# Patient Record
Sex: Female | Born: 1975 | Marital: Married | Smoking: Never smoker
Health system: Southern US, Community
[De-identification: ages and names within clinical notes are randomized; demographics above are authoritative.]

## PROBLEM LIST (undated history)

## (undated) DIAGNOSIS — G894 Chronic pain syndrome: Secondary | ICD-10-CM

## (undated) DIAGNOSIS — R059 Cough, unspecified: Secondary | ICD-10-CM

## (undated) DIAGNOSIS — R05 Cough: Secondary | ICD-10-CM

## (undated) DIAGNOSIS — F32A Depression, unspecified: Secondary | ICD-10-CM

## (undated) DIAGNOSIS — R509 Fever, unspecified: Secondary | ICD-10-CM

## (undated) DIAGNOSIS — Z6841 Body Mass Index (BMI) 40.0 and over, adult: Secondary | ICD-10-CM

## (undated) DIAGNOSIS — J069 Acute upper respiratory infection, unspecified: Secondary | ICD-10-CM

## (undated) DIAGNOSIS — G500 Trigeminal neuralgia: Secondary | ICD-10-CM

## (undated) DIAGNOSIS — F419 Anxiety disorder, unspecified: Secondary | ICD-10-CM

## (undated) DIAGNOSIS — M26609 Unspecified temporomandibular joint disorder, unspecified side: Secondary | ICD-10-CM

## (undated) DIAGNOSIS — E039 Hypothyroidism, unspecified: Secondary | ICD-10-CM

## (undated) DIAGNOSIS — D518 Other vitamin B12 deficiency anemias: Secondary | ICD-10-CM

## (undated) DIAGNOSIS — K589 Irritable bowel syndrome without diarrhea: Secondary | ICD-10-CM

## (undated) DIAGNOSIS — K219 Gastro-esophageal reflux disease without esophagitis: Secondary | ICD-10-CM

## (undated) DIAGNOSIS — Z5189 Encounter for other specified aftercare: Secondary | ICD-10-CM

## (undated) DIAGNOSIS — M170 Bilateral primary osteoarthritis of knee: Secondary | ICD-10-CM

## (undated) DIAGNOSIS — F410 Panic disorder [episodic paroxysmal anxiety] without agoraphobia: Secondary | ICD-10-CM

## (undated) DIAGNOSIS — M1990 Unspecified osteoarthritis, unspecified site: Secondary | ICD-10-CM

## (undated) DIAGNOSIS — N3010 Interstitial cystitis (chronic) without hematuria: Secondary | ICD-10-CM

## (undated) DIAGNOSIS — Q600 Renal agenesis, unilateral: Secondary | ICD-10-CM

## (undated) DIAGNOSIS — IMO0002 Reserved for concepts with insufficient information to code with codable children: Secondary | ICD-10-CM

## (undated) DIAGNOSIS — E559 Vitamin D deficiency, unspecified: Secondary | ICD-10-CM

## (undated) DIAGNOSIS — F329 Major depressive disorder, single episode, unspecified: Secondary | ICD-10-CM

## (undated) DIAGNOSIS — H6690 Otitis media, unspecified, unspecified ear: Secondary | ICD-10-CM

## (undated) DIAGNOSIS — E782 Mixed hyperlipidemia: Secondary | ICD-10-CM

## (undated) DIAGNOSIS — K582 Mixed irritable bowel syndrome: Secondary | ICD-10-CM

## (undated) DIAGNOSIS — M797 Fibromyalgia: Secondary | ICD-10-CM

## (undated) DIAGNOSIS — N809 Endometriosis, unspecified: Secondary | ICD-10-CM

## (undated) HISTORY — DX: Trigeminal neuralgia: G50.0

## (undated) HISTORY — DX: Anxiety disorder, unspecified: F41.9

## (undated) HISTORY — DX: Vitamin D deficiency, unspecified: E55.9

## (undated) HISTORY — DX: Hypothyroidism, unspecified: E03.9

## (undated) HISTORY — DX: Other vitamin B12 deficiency anemias: D51.8

## (undated) HISTORY — DX: Endometriosis, unspecified: N80.9

## (undated) HISTORY — DX: Unspecified osteoarthritis, unspecified site: M19.90

## (undated) HISTORY — DX: Reserved for concepts with insufficient information to code with codable children: IMO0002

## (undated) HISTORY — DX: Otitis media, unspecified, unspecified ear: H66.90

## (undated) HISTORY — DX: Cough, unspecified: R05.9

## (undated) HISTORY — DX: Interstitial cystitis (chronic) without hematuria: N30.10

## (undated) HISTORY — DX: Panic disorder (episodic paroxysmal anxiety): F41.0

## (undated) HISTORY — DX: Unspecified temporomandibular joint disorder, unspecified side: M26.609

## (undated) HISTORY — DX: Mixed hyperlipidemia: E78.2

## (undated) HISTORY — DX: Acute upper respiratory infection, unspecified: J06.9

## (undated) HISTORY — DX: Mixed irritable bowel syndrome: K58.2

## (undated) HISTORY — DX: Depression, unspecified: F32.A

## (undated) HISTORY — DX: Irritable bowel syndrome without diarrhea: K58.9

## (undated) HISTORY — DX: Fever, unspecified: R50.9

## (undated) HISTORY — DX: Bilateral primary osteoarthritis of knee: M17.0

## (undated) HISTORY — DX: Morbid (severe) obesity due to excess calories: E66.01

## (undated) HISTORY — DX: Fibromyalgia: M79.7

## (undated) HISTORY — DX: Chronic pain syndrome: G89.4

## (undated) HISTORY — DX: Body Mass Index (BMI) 40.0 and over, adult: Z684

## (undated) HISTORY — DX: Gastro-esophageal reflux disease without esophagitis: K21.9

## (undated) HISTORY — PX: CARPAL TUNNEL RELEASE: SHX101

## (undated) HISTORY — DX: Irritable bowel syndrome, unspecified: K58.9

---
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
## 1997-07-27 HISTORY — PX: OTHER SURGICAL HISTORY: SHX169

## 2017-06-26 HISTORY — PX: OTHER SURGICAL HISTORY: SHX169

## 2018-03-22 DIAGNOSIS — N3010 Interstitial cystitis (chronic) without hematuria: Secondary | ICD-10-CM | POA: Insufficient documentation

## 2018-12-06 ENCOUNTER — Ambulatory Visit: Payer: Commercial Managed Care - PPO | Admitting: Neurology

## 2018-12-12 ENCOUNTER — Encounter: Payer: Self-pay | Admitting: *Deleted

## 2018-12-12 ENCOUNTER — Ambulatory Visit (INDEPENDENT_AMBULATORY_CARE_PROVIDER_SITE_OTHER): Payer: Commercial Managed Care - PPO | Admitting: Neurology

## 2018-12-12 ENCOUNTER — Other Ambulatory Visit: Payer: Self-pay

## 2018-12-12 DIAGNOSIS — M26623 Arthralgia of bilateral temporomandibular joint: Secondary | ICD-10-CM | POA: Diagnosis not present

## 2018-12-12 DIAGNOSIS — E039 Hypothyroidism, unspecified: Secondary | ICD-10-CM

## 2018-12-12 DIAGNOSIS — G500 Trigeminal neuralgia: Secondary | ICD-10-CM

## 2018-12-12 DIAGNOSIS — R51 Headache: Secondary | ICD-10-CM | POA: Diagnosis not present

## 2018-12-12 DIAGNOSIS — R519 Headache, unspecified: Secondary | ICD-10-CM

## 2018-12-12 DIAGNOSIS — H5710 Ocular pain, unspecified eye: Secondary | ICD-10-CM

## 2018-12-12 MED ORDER — GABAPENTIN 300 MG PO CAPS: 600.0000 mg | ORAL_CAPSULE | Freq: Three times a day (TID) | ORAL | 6 refills | Status: DC
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
## 2018-12-13 ENCOUNTER — Encounter: Payer: Self-pay | Admitting: Neurology

## 2018-12-13 DIAGNOSIS — F410 Panic disorder [episodic paroxysmal anxiety] without agoraphobia: Secondary | ICD-10-CM | POA: Insufficient documentation

## 2018-12-13 DIAGNOSIS — M170 Bilateral primary osteoarthritis of knee: Secondary | ICD-10-CM | POA: Insufficient documentation

## 2018-12-13 DIAGNOSIS — IMO0002 Reserved for concepts with insufficient information to code with codable children: Secondary | ICD-10-CM | POA: Insufficient documentation

## 2018-12-13 DIAGNOSIS — Q600 Renal agenesis, unilateral: Secondary | ICD-10-CM | POA: Insufficient documentation

## 2018-12-13 DIAGNOSIS — E782 Mixed hyperlipidemia: Secondary | ICD-10-CM | POA: Insufficient documentation

## 2018-12-13 DIAGNOSIS — R05 Cough: Secondary | ICD-10-CM | POA: Insufficient documentation

## 2018-12-13 DIAGNOSIS — G894 Chronic pain syndrome: Secondary | ICD-10-CM | POA: Insufficient documentation

## 2018-12-13 DIAGNOSIS — Z6841 Body Mass Index (BMI) 40.0 and over, adult: Secondary | ICD-10-CM | POA: Insufficient documentation

## 2018-12-13 DIAGNOSIS — F419 Anxiety disorder, unspecified: Secondary | ICD-10-CM | POA: Insufficient documentation

## 2018-12-13 DIAGNOSIS — R059 Cough, unspecified: Secondary | ICD-10-CM | POA: Insufficient documentation

## 2018-12-13 DIAGNOSIS — D518 Other vitamin B12 deficiency anemias: Secondary | ICD-10-CM | POA: Insufficient documentation

## 2018-12-13 DIAGNOSIS — N809 Endometriosis, unspecified: Secondary | ICD-10-CM | POA: Insufficient documentation

## 2018-12-13 DIAGNOSIS — K582 Mixed irritable bowel syndrome: Secondary | ICD-10-CM | POA: Insufficient documentation

## 2018-12-13 DIAGNOSIS — N3010 Interstitial cystitis (chronic) without hematuria: Secondary | ICD-10-CM | POA: Insufficient documentation

## 2018-12-13 DIAGNOSIS — H6690 Otitis media, unspecified, unspecified ear: Secondary | ICD-10-CM | POA: Insufficient documentation

## 2018-12-13 DIAGNOSIS — E039 Hypothyroidism, unspecified: Secondary | ICD-10-CM | POA: Insufficient documentation

## 2018-12-13 DIAGNOSIS — K219 Gastro-esophageal reflux disease without esophagitis: Secondary | ICD-10-CM | POA: Insufficient documentation

## 2018-12-13 DIAGNOSIS — G500 Trigeminal neuralgia: Secondary | ICD-10-CM | POA: Insufficient documentation

## 2018-12-13 DIAGNOSIS — E559 Vitamin D deficiency, unspecified: Secondary | ICD-10-CM | POA: Insufficient documentation

## 2018-12-13 NOTE — Patient Instructions (Signed)
Trigeminal Neuralgia    Trigeminal neuralgia is a nerve disorder that causes attacks of severe facial pain. The attacks last from a few seconds to several minutes. They can happen for days, weeks, or months and then go away for months or years. Trigeminal neuralgia is also called tic douloureux.  What are the causes?  This condition is caused by damage to a nerve in the face that is called the trigeminal nerve. An attack can be triggered by:  · Talking.  · Chewing.  · Putting on makeup.  · Washing your face.  · Shaving your face.  · Brushing your teeth.  · Touching your face.  What increases the risk?  This condition is more likely to develop in:  · Women.  · People who are 50 years of age or older.  What are the signs or symptoms?  The main symptom of this condition is pain in the jaw, lips, eyes, nose, scalp, forehead, and face. The pain may be intense, stabbing, electric, or shock-like.  How is this diagnosed?  This condition is diagnosed with a physical exam. A CT scan or MRI may be done to rule out other conditions that can cause facial pain.  How is this treated?  This condition may be treated with:  · Avoiding the things that trigger your attacks.  · Pain medicine.  · Surgery. This may be done in severe cases if other medical treatment does not provide relief.  Follow these instructions at home:  · Take over-the-counter and prescription medicines only as told by your health care provider.  · If you wish to get pregnant, talk with your health care provider before you start trying to get pregnant.  · Avoid the things that trigger your attacks. It may help to:  ? Chew on the unaffected side of your mouth.  ? Avoid touching your face.  ? Avoid blasts of hot or cold air.  Contact a health care provider if:  · Your pain medicine is not helping.  · You develop new, unexplained symptoms, such as:  ? Double vision.  ? Facial weakness.  ? Changes in hearing or balance.  · You become pregnant.  Get help right away  if:  · Your pain is unbearable, and your pain medicine does not help.  This information is not intended to replace advice given to you by your health care provider. Make sure you discuss any questions you have with your health care provider.  Document Released: 07/10/2000 Document Revised: 06/11/2017 Document Reviewed: 11/05/2014  Elsevier Interactive Patient Education © 2019 [employer]vier Inc[employer]

## 2018-12-21 ENCOUNTER — Telehealth: Payer: Self-pay | Admitting: Neurology

## 2018-12-21 NOTE — Telephone Encounter (Signed)
lvm for pt to call back about scheduling mri  [facility] Auth: NPR spoke to [facility] Ref # [id number]

## 2018-12-21 NOTE — Telephone Encounter (Signed)
Patient returned my call she is scheduled for 12/28/18 at [facility].  °

## 2018-12-28 ENCOUNTER — Ambulatory Visit: Payer: Commercial Managed Care - PPO

## 2018-12-28 ENCOUNTER — Other Ambulatory Visit: Payer: Self-pay

## 2018-12-28 NOTE — Telephone Encounter (Signed)
Patient came in today to attempt the MRI she was unable to do it because she was claustrophobic and would like something to help her. She would also like an open MRI so I am going to fax the order to [facility].

## 2018-12-29 ENCOUNTER — Other Ambulatory Visit: Payer: Self-pay | Admitting: Neurology

## 2018-12-29 MED ORDER — ALPRAZOLAM 0.25 MG PO TABS: ORAL_TABLET | ORAL | 0 refills | Status: DC

## 2018-12-29 NOTE — Telephone Encounter (Signed)
I left a voicemail informing the patient that Dr. Lucia Gaskins sent in Xanax and to take 30-60 mins before MRI and may repeat. Also for her to have a driver because it can make her sleepy. I also stated that I faxed the order to [facility] for her to have the Open MRI.

## 2018-12-29 NOTE — Telephone Encounter (Signed)
I sent in xanax. Take 1-2 tabs (0.25mg -0.50mg ) 30-60 minutes before MRI. May repeat if needed.Do not drive. Please let her know thanks.

## 2018-12-29 NOTE — Progress Notes (Signed)
Xanax for mri

## 2019-01-02 NOTE — Telephone Encounter (Signed)
Patient is scheduled at [facility] for 01/11/19. °

## 2019-01-16 ENCOUNTER — Telehealth: Payer: Self-pay | Admitting: Neurology

## 2019-01-16 NOTE — Telephone Encounter (Signed)
Patient called to check on results from her MRI. She has not hear back and it has been a few days since she had it. Please call and advise.

## 2019-01-16 NOTE — Telephone Encounter (Signed)
Per GI patient were unable to complete the scan pt was claustrophobic.

## 2019-01-16 NOTE — Telephone Encounter (Signed)
Results not available yet.

## 2019-01-17 NOTE — Telephone Encounter (Signed)
Pt returned my call. She stated she was sent to [facility] for an open MRI and had that completed last Wednesday. I advised we would reach out to them and be back in touch. She verbalized appreciation.

## 2019-01-17 NOTE — Telephone Encounter (Signed)
I called the pt on [phone] and LVM asking for call back.

## 2019-01-20 ENCOUNTER — Telehealth: Payer: Self-pay | Admitting: Neurology

## 2019-01-20 NOTE — Telephone Encounter (Signed)
Pt called wanting to know if her MRI results have come in.

## 2019-01-23 NOTE — Telephone Encounter (Signed)
The MRI made reference to enlarged IIB lymph nodes on the left. That may be benign but can in some cases be more serious. Please forward a copy of the MRI and have patient f/u with pcp for further evaluation.Level IIB nodes are located posterior neck possibly around the thyroid, she should have these evaluated. Otherwise MRi brain normal, other than left TMJ.

## 2019-01-23 NOTE — Telephone Encounter (Signed)
Called pt & LVM asking for call back. Left office number in message.   When she calls back, please let her know that her MRI of her head was normal. Mild TMJ on left. This is great news.

## 2019-01-23 NOTE — Telephone Encounter (Signed)
MRI Head was normal. Mild TMJ on left. This is great news. thanks

## 2019-01-23 NOTE — Telephone Encounter (Signed)
I spoke with the patient and reviewed the MRI results with the patient from Dr. Jaynee Eagles regarding the enlarged lymph nodes, mild TMJ but otherwise normal MRI brain. She understands to call PCP to discuss the lymph node findings. Pt had some TGN questions. She will setup her mychart and will ask Dr. Jaynee Eagles through there as Dr. Jaynee Eagles had some information to share with her when she gets it setup. I sent her a link to her cell phone (confirmed #) so she can setup the [place].  She verbalized appreciation.

## 2019-01-23 NOTE — Telephone Encounter (Signed)
MRI trigeminal results faxed to Dr. Janetta Hora @ Fax: [phone]. Received a receipt of confirmation.

## 2019-02-28 ENCOUNTER — Telehealth: Payer: Self-pay | Admitting: Neurology

## 2019-02-28 NOTE — Telephone Encounter (Addendum)
It's fine with me but not sure if Dr. Brett Fairy will accept her. She has trigeminal neuralgia I performed an MRI with trigeminal protocol which did not show a vascular loop, she is already on medications(Gabapentin) and she declined Tegretol or Trileptal because it may interfere with birth control and she did not want to use a back up,  I suggested [facility] or Dr. Maryjean Ka for procedures likes gamma knife or other percutaneous options. I believe I asked her to send me an email so I could share a a nice you tube video and asked her to watch it it was all about the different interventions for TGN. Bethany, correct?

## 2019-02-28 NOTE — Telephone Encounter (Signed)
I called the pt back. She would like to switch to Dr. Brett Fairy. I advised a message would be sent to both providers to approve the switch. She verbalized appreciation.

## 2019-02-28 NOTE — Telephone Encounter (Signed)
Pt called requesting to switch provider's. Please advise.

## 2019-02-28 NOTE — Telephone Encounter (Signed)
Looks like you sent her the video on 7/9 through mychart.

## 2019-03-01 NOTE — Telephone Encounter (Signed)
I spoke with the patient. She is willing to give Amy a try. I scheduled her for an appt next Wed 8/12 @ 8:30 arrival 15 minutes prior. She verbalized appreciation for the call.

## 2019-03-01 NOTE — Telephone Encounter (Signed)
Unsure why she wants to switch, maybe she is unhappy with my management unfortunately. A sa compromise I think a follow up with Amy in the office may be best  and patient can see a new provider instead of me. Then if she still wants to see Dr. Brett Fairy we can discuss. Would you call her Romelle Starcher please and schedule with Amy? thanks

## 2019-03-03 NOTE — Telephone Encounter (Signed)
I don't think I have any other options for her than looking into gamma knife- I have used other medications, Lyrica, steroid dose packs, more or less based on anecdotal evidence. Not sure how Lyrica and birth control go together.

## 2019-03-08 ENCOUNTER — Ambulatory Visit: Payer: Self-pay | Admitting: Family Medicine

## 2019-03-10 ENCOUNTER — Encounter: Payer: Self-pay | Admitting: Gastroenterology

## 2019-03-28 ENCOUNTER — Other Ambulatory Visit: Payer: Self-pay

## 2019-03-28 ENCOUNTER — Encounter: Payer: Self-pay | Admitting: Gastroenterology

## 2019-03-28 ENCOUNTER — Ambulatory Visit: Payer: Commercial Managed Care - PPO | Admitting: Gastroenterology

## 2019-03-28 VITALS — BP 128/74 | HR 110 | Temp 98.3°F | Ht 66.0 in | Wt 290.4 lb

## 2019-03-28 DIAGNOSIS — K219 Gastro-esophageal reflux disease without esophagitis: Secondary | ICD-10-CM | POA: Diagnosis not present

## 2019-03-28 DIAGNOSIS — K581 Irritable bowel syndrome with constipation: Secondary | ICD-10-CM

## 2019-03-28 NOTE — Progress Notes (Signed)
Chief Complaint: Longstanding GERD  Referring Provider:  [facility], [facility] *      ASSESSMENT AND PLAN;   #1.  Longstanding GERD  #2. IBS with constipation.  Plan: -Continue omeprazole 20 mg p.o. once a day for now. -Proceed with EGD with possible SB bx. -Therafter, may give her a trial of gluten free diet -Avoid NSAIDs -Broch GERD given.  Avoid eating 3 hours before going to bed.  Raise the head end of the bed by 6 inches blocks. -Also encouraged her to lose weight. -Avoid nonsteroidals. -Increase water intake. -Please obtain labs from Dr. Stefanie LibelHaque's office. -As per current recommendations, recommend colon at age 89.  Certainly, earlier if she starts having any new problems or if there is any change in family history.  She agrees with approach.  HPI:    Brenda Ruiz is a 43 y.o. female  Very pleasant female Works at [employer]  With longstanding history of GERD x yrs, Dx early 2520s , more with fatty foods.  She describes this as heartburn with occasional regurgitation.  No odynophagia or dysphagia.  Has been on omeprazole 20 mg p.o. once a day but would have symptoms even if she misses a single dose.  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   No significant sodas, drinks 1 cup coffee QD.  Occasional chocolates, no mints, no chewing gums.  Has been taking 800 mg ibuprofen every day for back pain and arthritis.   Dx with IBS - occ constipation with passage of pellet-like stools and abdominal bloating. Better with benefiber.  There is no melena or hematochezia. No unintentional weight loss.  Per patient-TSH was normal recently.   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 Past Medical History:  Diagnosis Date  . Acute upper respiratory infection, unspecified   . Anxiety   . Anxiety disorder, unspecified   . Arthritis   . Bilateral primary osteoarthritis of knee    right   . BMI  45.0-49.9, adult (HCC)   . Chronic otitis media    tubes in ears  . Chronic pain syndrome   . Cough   . Depression   . Endometriosis   . Fever, unspecified   . Fibromyalgia   . GERD without esophagitis   . Hypothyroid   . IBS (irritable bowel syndrome)   . Interstitial cystitis (chronic) without hematuria   . Mixed hyperlipidemia   . Mixed irritable bowel syndrome   . Morbid obesity due to excess calories (HCC)   . Other vitamin B12 deficiency anemias   . Panic disorder (episodic paroxysmal anxiety)   . Solitary right kidney   . TMJ (temporomandibular joint disorder)   . Trigeminal neuralgia   . Trigeminal neuralgia   . Vitamin D deficiency     Past Surgical History:  Procedure Laterality Date  . bladder stent  1999  . NEPHRECTOMY Left 1989  . tubes in ears  06/2017    Family History  Problem Relation Age of Onset  . Hypertension Mother   . Hyperlipidemia Mother   . Stroke Maternal Grandmother   . Diabetes type II Maternal Grandfather   . Hypertension Maternal Grandfather   . Neuropathy Neg Hx   . Colon cancer Neg Hx   . Esophageal cancer Neg Hx     Social History   Tobacco Use  . Smoking status: Former Games developermoker  . Smokeless tobacco: Never Used  Substance  Use Topics  . Alcohol use: Yes    Comment: ocassionally/1-2 times a week   . Drug use: Not Currently    Current Outpatient Medications  Medication Sig Dispense Refill  . ALPRAZolam (XANAX) 0.25 MG tablet Take 0.25 mg by mouth 2 (two) times a day.    . ALPRAZolam (XANAX) 0.25 MG tablet Take 1-2 tabs (0.25mg -0.50mg ) 30-60 minutes before procedure. May repeat if needed.Do not drive. 4 tablet 0  . Biotin 1000 MCG tablet Take 1,000 mcg by mouth daily.    . cetirizine (ZYRTEC) 10 MG tablet Take 10 mg by mouth daily.    . cyanocobalamin (,VITAMIN B-12,) 1000 MCG/ML injection Inject 1,000 mcg into the muscle once a week.    . cyclobenzaprine (FLEXERIL) 10 MG tablet Take 10 mg by mouth 3 (three) times daily as  needed (TMJ pain).    Marland Kitchen dicyclomine (BENTYL) 10 MG capsule Take 10 mg by mouth 4 (four) times daily.    . fluticasone (FLONASE) 50 MCG/ACT nasal spray Place 1 spray into both nostrils daily.    Marland Kitchen gabapentin (NEURONTIN) 300 MG capsule Take 2 capsules (600 mg total) by mouth 3 (three) times daily. 180 capsule 6  . hydrOXYzine (VISTARIL) 25 MG capsule Take 25 mg by mouth 2 (two) times a day.    . levothyroxine (SYNTHROID) 200 MCG tablet Take with 75 mcg tablet by mouth every morning on an empty stomach.    . levothyroxine (SYNTHROID) 75 MCG tablet Take by mouth with the 200 mcg tablet every morning on an empty stomach.    . predniSONE (DELTASONE) 20 MG tablet Take 40 mg by mouth daily.    . promethazine (PHENERGAN) 25 MG tablet Take 25 mg by mouth daily as needed for nausea or vomiting.    . Red Yeast Rice 600 MG CAPS Take by mouth.    . sertraline (ZOLOFT) 50 MG tablet Take 50 mg by mouth daily.    . traMADol (ULTRAM) 50 MG tablet Take by mouth every 6 (six) hours as needed.     No current facility-administered medications for this visit.     Not on File  Review of Systems:  Constitutional: Denies fever, chills, diaphoresis, appetite change and fatigue.  HEENT: Denies photophobia, eye pain, redness, hearing loss, ear pain, congestion, sore throat, rhinorrhea, sneezing, mouth sores, neck pain, neck stiffness and tinnitus.   Respiratory: Denies SOB, DOE, cough, chest tightness,  and wheezing.   Cardiovascular: Denies chest pain, palpitations and leg swelling.  Genitourinary: Has IC Musculoskeletal: Has myalgias, back pain, joint swelling, arthralgias and no gait problem.  Skin: No rash.  Neurological: Denies dizziness, seizures, syncope, weakness, light-headedness, numbness and headaches.  Hematological: Denies adenopathy. Easy bruising, personal or family bleeding history  Psychiatric/Behavioral: has anxiety or depression     Physical Exam:    BP 128/74   Pulse (!) 110   Temp 98.3 F  (36.8 C)   Ht 5\' 6"  (1.676 m)   Wt 290 lb 6 oz (131.7 kg)   BMI 46.87 kg/m  Filed Weights   03/28/19 0914  Weight: 290 lb 6 oz (131.7 kg)   Constitutional:  Well-developed, in no acute distress. Psychiatric: Normal mood and affect. Behavior is normal. HEENT: Pupils normal.  Conjunctivae are normal. No scleral icterus. Neck supple.  Cardiovascular: Normal rate, regular rhythm. No edema Pulmonary/chest: Effort normal and breath sounds normal. No wheezing, rales or rhonchi. Abdominal: Soft, nondistended. Nontender. Bowel sounds active throughout. There are no masses palpable. No hepatomegaly. Rectal:  defered Neurological: Alert and oriented to person place and time. Skin: Skin is warm and dry. No rashes noted.  Data Reviewed: I have personally reviewed following labs and imaging studies    Carmell Austria, MD 03/28/2019, 9:40 AM  Cc: Alanson Puls, [facility] *

## 2019-03-28 NOTE — Patient Instructions (Signed)
If you are age 43 or older, your body mass index should be between 23-30. Your Body mass index is 46.87 kg/m. If this is out of the aforementioned range listed, please consider follow up with your Primary Care Provider.  If you are age 41 or younger, your body mass index should be between 19-25. Your Body mass index is 46.87 kg/m. If this is out of the aformentioned range listed, please consider follow up with your Primary Care Provider.   You have been scheduled for an endoscopy. Please follow written instructions given to you at your visit today. If you use inhalers (even only as needed), please bring them with you on the day of your procedure. Your physician has requested that you go to www.startemmi.com and enter the access code given to you at your visit today. This web site gives a general overview about your procedure. However, you should still follow specific instructions given to you by our office regarding your preparation for the procedure.  Thank you,  Dr. Jackquline Denmark

## 2019-03-30 ENCOUNTER — Telehealth: Payer: Self-pay

## 2019-03-30 NOTE — Telephone Encounter (Signed)
Covid-19 screening questions   Do you now or have you had a fever in the last 14 days? NO   Do you have any respiratory symptoms of shortness of breath or cough now or in the last 14 days? NO  Do you have any family members or close contacts with diagnosed or suspected Covid-19 in the past 14 days? NO  Have you been tested for Covid-19 and found to be positive? NO        

## 2019-03-31 ENCOUNTER — Ambulatory Visit (AMBULATORY_SURGERY_CENTER): Payer: Commercial Managed Care - PPO | Admitting: Gastroenterology

## 2019-03-31 ENCOUNTER — Other Ambulatory Visit: Payer: Self-pay

## 2019-03-31 ENCOUNTER — Encounter: Payer: Self-pay | Admitting: Gastroenterology

## 2019-03-31 VITALS — BP 126/78 | HR 99 | Temp 97.9°F | Resp 11 | Ht 66.0 in | Wt 290.0 lb

## 2019-03-31 DIAGNOSIS — K219 Gastro-esophageal reflux disease without esophagitis: Secondary | ICD-10-CM | POA: Diagnosis present

## 2019-03-31 DIAGNOSIS — K2970 Gastritis, unspecified, without bleeding: Secondary | ICD-10-CM | POA: Diagnosis not present

## 2019-03-31 MED ORDER — SODIUM CHLORIDE 0.9 % IV SOLN: 500.0000 mL | Freq: Once | INTRAVENOUS | Status: DC

## 2019-03-31 NOTE — Op Note (Signed)
[facility] Patient Name: Brenda Ruiz Procedure Date: 03/31/2019 3:37 PM MRN: [id number] Endoscopist: Jackquline Denmark , MD Age: 43 Referring MD:  Date of Birth: 05/27/1976 Gender: Female Account #: [id number] Procedure:                Upper GI endoscopy Indications:              Long standing heartburn. Medicines:                Monitored Anesthesia Care Procedure:                Pre-Anesthesia Assessment:                           - Prior to the procedure, a History and Physical                            was performed, and patient medications and                            allergies were reviewed. The patient's tolerance of                            previous anesthesia was also reviewed. The risks                            and benefits of the procedure and the sedation                            options and risks were discussed with the patient.                            All questions were answered, and informed consent                            was obtained. Prior Anticoagulants: The patient has                            taken no previous anticoagulant or antiplatelet                            agents. ASA Grade Assessment: II - A patient with                            mild systemic disease. After reviewing the risks                            and benefits, the patient was deemed in                            satisfactory condition to undergo the procedure.                           After obtaining informed consent, the endoscope was  passed under direct vision. Throughout the                            procedure, the patient's blood pressure, pulse, and                            oxygen saturations were monitored continuously. The                            Endoscope was introduced through the mouth, and                            advanced to the second part of duodenum. The upper                            GI endoscopy was accomplished  without difficulty.                            The patient tolerated the procedure well. Scope In: Scope Out: Findings:                 The examined esophagus was normal. Biopsies were                            obtained from the proximal and distal esophagus                            with cold forceps for histology of suspected                            eosinophilic esophagitis.                           The Z-line was regular and was found 38 cm from the                            incisors. Examined by narrowband imaging.                           Localized mild inflammation characterized by                            erythema was found in the gastric body and in the                            gastric antrum. Biopsies were taken with a cold                            forceps for histology.                           The examined duodenum was normal. Biopsies for                            histology were taken  with a cold forceps for                            evaluation of celiac disease. Estimated blood loss:                            none. Complications:            No immediate complications. Estimated Blood Loss:     Estimated blood loss: none. Impression:               -Mild gastritis                           -Otherwise normal EGD Recommendation:           - Patient has a contact number available for                            emergencies. The signs and symptoms of potential                            delayed complications were discussed with the                            patient. Return to normal activities tomorrow.                            Written discharge instructions were provided to the                            patient.                           - Trial of gluten-free diet diet.                           - Continue omeprazole 20 mg p.o. once a day.                           - Await pathology results.                           - No aspirin, ibuprofen, naproxen, or  other                            non-steroidal anti-inflammatory drugs.                           - Encouraged to reduce weight.                           - FU in 12 weeks.                           - D/W Farrel GordonAnthony Arran Fessel, MD 03/31/2019 4:08:07 PM This report has been signed electronically.

## 2019-03-31 NOTE — Progress Notes (Signed)
Called to room to assist during endoscopic procedure.  Patient ID and intended procedure confirmed with present staff. Received instructions for my participation in the procedure from the performing physician.  

## 2019-03-31 NOTE — Progress Notes (Signed)
PT taken to PACU. Monitors in place. VSS. Report given to RN. 

## 2019-03-31 NOTE — Patient Instructions (Signed)
Await pathology results.  Trial of gluten free diet.  Continue omeprazole 20 mg by mouth once a day.  No aspirin, ibuprofen, naproxen or other non steroidal anti inflammatory medications.  YOU HAD AN ENDOSCOPIC PROCEDURE TODAY AT [facility]:   Refer to the procedure report that was given to you for any specific questions about what was found during the examination.  If the procedure report does not answer your questions, please call your gastroenterologist to clarify.  If you requested that your care partner not be given the details of your procedure findings, then the procedure report has been included in a sealed envelope for you to review at your convenience later.  YOU SHOULD EXPECT: Some feelings of bloating in the abdomen. Passage of more gas than usual.  Walking can help get rid of the air that was put into your GI tract during the procedure and reduce the bloating. If you had a lower endoscopy (such as a colonoscopy or flexible sigmoidoscopy) you may notice spotting of blood in your stool or on the toilet paper. If you underwent a bowel prep for your procedure, you may not have a normal bowel movement for a few days.  Please Note:  You might notice some irritation and congestion in your nose or some drainage.  This is from the oxygen used during your procedure.  There is no need for concern and it should clear up in a day or so.  SYMPTOMS TO REPORT IMMEDIATELY:    Following upper endoscopy (EGD)  Vomiting of blood or coffee ground material  New chest pain or pain under the shoulder blades  Painful or persistently difficult swallowing  New shortness of breath  Fever of 100F or higher  Black, tarry-looking stools  For urgent or emergent issues, a gastroenterologist can be reached at any hour by calling [phone].   DIET:  We do recommend a small meal at first, but then you may proceed to your regular diet.  Drink plenty of fluids but you should avoid  alcoholic beverages for 24 hours.  ACTIVITY:  You should plan to take it easy for the rest of today and you should NOT DRIVE or use heavy machinery until tomorrow (because of the sedation medicines used during the test).    FOLLOW UP: Our staff will call the number listed on your records 48-72 hours following your procedure to check on you and address any questions or concerns that you may have regarding the information given to you following your procedure. If we do not reach you, we will leave a message.  We will attempt to reach you two times.  During this call, we will ask if you have developed any symptoms of COVID 19. If you develop any symptoms (ie: fever, flu-like symptoms, shortness of breath, cough etc.) before then, please call [phone].  If you test positive for Covid 19 in the 2 weeks post procedure, please call and report this information to [place].    If any biopsies were taken you will be contacted by phone or by letter within the next 1-3 weeks.  Please call us at [phone] if you have not heard about the biopsies in 3 weeks.    SIGNATURES/CONFIDENTIALITY: You and/or your care partner have signed paperwork which will be entered into your electronic medical record.  These signatures attest to the fact that that the information above on your After Visit Summary has been reviewed and is understood.  Full responsibility of the confidentiality  of this discharge information lies with you and/or your care-partner.

## 2019-04-05 ENCOUNTER — Telehealth: Payer: Self-pay

## 2019-04-05 NOTE — Telephone Encounter (Signed)
  Follow up Call-  Call back number 03/31/2019  Post procedure Call Back phone  # [phone]  Permission to leave phone message Yes     Patient questions:  Do you have a fever, pain , or abdominal swelling? No. Pain Score  0 *  Have you tolerated food without any problems? Yes.    Have you been able to return to your normal activities? Yes.    Do you have any questions about your discharge instructions: Diet   No. Medications  No. Follow up visit  No.  Do you have questions or concerns about your Care? No.  Actions: * If pain score is 4 or above: No action needed, pain <4. 1. Have you developed a fever since your procedure? no  2.   Have you had an respiratory symptoms (SOB or cough) since your procedure? no  3.   Have you tested positive for COVID 19 since your procedure no  4.   Have you had any family members/close contacts diagnosed with the COVID 19 since your procedure?  no   If yes to any of these questions please route to Joylene John, RN and Alphonsa Gin, Therapist, sports.

## 2019-04-16 ENCOUNTER — Encounter: Payer: Self-pay | Admitting: Gastroenterology

## 2019-08-02 DIAGNOSIS — Z20828 Contact with and (suspected) exposure to other viral communicable diseases: Secondary | ICD-10-CM | POA: Diagnosis not present

## 2019-08-02 DIAGNOSIS — U071 COVID-19: Secondary | ICD-10-CM | POA: Diagnosis not present

## 2019-08-09 DIAGNOSIS — E782 Mixed hyperlipidemia: Secondary | ICD-10-CM | POA: Diagnosis not present

## 2019-08-09 DIAGNOSIS — G500 Trigeminal neuralgia: Secondary | ICD-10-CM | POA: Diagnosis not present

## 2019-08-09 DIAGNOSIS — D518 Other vitamin B12 deficiency anemias: Secondary | ICD-10-CM | POA: Diagnosis not present

## 2019-08-09 DIAGNOSIS — E038 Other specified hypothyroidism: Secondary | ICD-10-CM | POA: Diagnosis not present

## 2019-08-09 DIAGNOSIS — E119 Type 2 diabetes mellitus without complications: Secondary | ICD-10-CM | POA: Diagnosis not present

## 2019-08-09 DIAGNOSIS — E559 Vitamin D deficiency, unspecified: Secondary | ICD-10-CM | POA: Diagnosis not present

## 2019-08-09 DIAGNOSIS — I10 Essential (primary) hypertension: Secondary | ICD-10-CM | POA: Diagnosis not present

## 2019-08-09 MED FILL — FLUTICASONE PROP 50 MCG SPR: 50 | 60 days supply | Qty: 16 | Fill #0

## 2019-08-09 MED FILL — OMEPRAZOLE 40 MG CPDR: 40 | 30 days supply | Qty: 30 | Fill #0

## 2019-08-09 MED FILL — LARIN 1.5 MG-30 MCG TABLET: 1.5-30 | 63 days supply | Qty: 63 | Fill #0

## 2019-08-10 MED FILL — SYNTHROID 200 MCG TABLET: 200 | 90 days supply | Qty: 90 | Fill #0

## 2019-08-10 MED FILL — VIT D2 1.25 MG (50,000 UNIT: 1.25 MG | 84 days supply | Qty: 12 | Fill #0

## 2019-08-30 DIAGNOSIS — Z713 Dietary counseling and surveillance: Secondary | ICD-10-CM | POA: Diagnosis not present

## 2019-09-01 ENCOUNTER — Other Ambulatory Visit (HOSPITAL_COMMUNITY): Payer: Self-pay | Admitting: Internal Medicine

## 2019-09-01 MED FILL — CETIRIZINE HCL 10 MG TABS: 10 | 30 days supply | Qty: 30 | Fill #0

## 2019-09-04 MED FILL — DICYCLOMINE 10 MG CAPSULE: 10 | 30 days supply | Qty: 120 | Fill #0

## 2019-09-12 DIAGNOSIS — E782 Mixed hyperlipidemia: Secondary | ICD-10-CM | POA: Diagnosis not present

## 2019-09-12 DIAGNOSIS — E038 Other specified hypothyroidism: Secondary | ICD-10-CM | POA: Diagnosis not present

## 2019-09-12 MED FILL — OMEPRAZOLE 40 MG CPDR: 40 | 30 days supply | Qty: 30 | Fill #1

## 2019-09-29 MED FILL — CETIRIZINE HCL 10 MG TABS: 10 | 30 days supply | Qty: 30 | Fill #1

## 2019-10-04 MED FILL — GABAPENTIN 600 MG TABLET: 600 | 60 days supply | Qty: 60 | Fill #0

## 2019-10-04 MED FILL — FLUTICASONE PROP 50 MCG SPR: 50 | 60 days supply | Qty: 16 | Fill #1

## 2019-10-04 MED FILL — SERTRALINE HCL 100 MG TAB: 100 | 90 days supply | Qty: 90 | Fill #0

## 2019-10-04 MED FILL — LARIN 1.5 MG-30 MCG TABLET: 1.5-30 | 84 days supply | Qty: 63 | Fill #0

## 2019-10-04 MED FILL — PHENAZOPYRIDINE 200 MG TAB: 200 | 2 days supply | Qty: 6 | Fill #0

## 2019-10-04 MED FILL — HYDROXYZINE PAMOATE 25 MG C: 25 | 90 days supply | Qty: 180 | Fill #0

## 2019-10-06 MED FILL — PROMETHAZINE 25 MG TABLET: 25 | 30 days supply | Qty: 30 | Fill #0

## 2019-10-09 MED FILL — OMEPRAZOLE 40 MG CPDR: 40 | 30 days supply | Qty: 30 | Fill #2

## 2019-10-23 MED FILL — BACLOFEN 20 MG TABS: 20 | 90 days supply | Qty: 270 | Fill #0

## 2019-10-30 MED FILL — CETIRIZINE HCL 10 MG TABS: 10 | 30 days supply | Qty: 30 | Fill #2

## 2019-10-30 MED FILL — DICYCLOMINE 10 MG CAPSULE: 10 | 30 days supply | Qty: 120 | Fill #1

## 2019-11-09 DIAGNOSIS — D518 Other vitamin B12 deficiency anemias: Secondary | ICD-10-CM | POA: Diagnosis not present

## 2019-11-09 DIAGNOSIS — F419 Anxiety disorder, unspecified: Secondary | ICD-10-CM | POA: Diagnosis not present

## 2019-11-09 DIAGNOSIS — F410 Panic disorder [episodic paroxysmal anxiety] without agoraphobia: Secondary | ICD-10-CM | POA: Diagnosis not present

## 2019-11-09 DIAGNOSIS — E038 Other specified hypothyroidism: Secondary | ICD-10-CM | POA: Diagnosis not present

## 2019-11-09 DIAGNOSIS — K582 Mixed irritable bowel syndrome: Secondary | ICD-10-CM | POA: Diagnosis not present

## 2019-11-09 DIAGNOSIS — Z79899 Other long term (current) drug therapy: Secondary | ICD-10-CM | POA: Diagnosis not present

## 2019-11-09 MED FILL — VIT D2 1.25 MG (50,000 UNIT: 1.25 MG | 84 days supply | Qty: 12 | Fill #1

## 2019-11-15 MED FILL — OMEPRAZOLE 40 MG CPDR: 40 | 30 days supply | Qty: 30 | Fill #0

## 2019-11-28 MED FILL — GABAPENTIN 600 MG TABLET: 600 | 60 days supply | Qty: 60 | Fill #1

## 2019-11-28 MED FILL — FLUTICASONE PROP 50 MCG SPR: 50 | 60 days supply | Qty: 16 | Fill #2

## 2019-11-28 MED FILL — CETIRIZINE HCL 10 MG TABS: 10 | 30 days supply | Qty: 30 | Fill #0

## 2019-12-14 MED FILL — OMEPRAZOLE 40 MG CPDR: 40 | 30 days supply | Qty: 30 | Fill #1

## 2019-12-14 MED FILL — SERTRALINE HCL 100 MG TAB: 100 | 90 days supply | Qty: 180 | Fill #0

## 2019-12-26 DIAGNOSIS — M19031 Primary osteoarthritis, right wrist: Secondary | ICD-10-CM | POA: Diagnosis not present

## 2019-12-26 DIAGNOSIS — M25531 Pain in right wrist: Secondary | ICD-10-CM | POA: Diagnosis not present

## 2020-01-10 DIAGNOSIS — Z01419 Encounter for gynecological examination (general) (routine) without abnormal findings: Secondary | ICD-10-CM | POA: Diagnosis not present

## 2020-01-10 DIAGNOSIS — R5382 Chronic fatigue, unspecified: Secondary | ICD-10-CM | POA: Diagnosis not present

## 2020-01-10 DIAGNOSIS — R5383 Other fatigue: Secondary | ICD-10-CM | POA: Diagnosis not present

## 2020-01-10 DIAGNOSIS — N760 Acute vaginitis: Secondary | ICD-10-CM | POA: Diagnosis not present

## 2020-01-10 DIAGNOSIS — Z0000 Encounter for general adult medical examination without abnormal findings: Secondary | ICD-10-CM | POA: Diagnosis not present

## 2020-01-10 DIAGNOSIS — Z79899 Other long term (current) drug therapy: Secondary | ICD-10-CM | POA: Diagnosis not present

## 2020-01-15 ENCOUNTER — Other Ambulatory Visit (HOSPITAL_COMMUNITY): Payer: Self-pay | Admitting: Nurse Practitioner

## 2020-01-15 MED FILL — LEVOTHYROXINE SODIUM 25 MCG: 25 | 30 days supply | Qty: 30 | Fill #0

## 2020-01-15 MED FILL — ROSUVASTATIN CALCIUM 10 MG: 10 | 30 days supply | Qty: 30 | Fill #0

## 2020-01-16 MED FILL — OMEPRAZOLE 40 MG CPDR: 40 | 30 days supply | Qty: 30 | Fill #2

## 2020-01-23 MED FILL — GABAPENTIN 600 MG TABLET: 600 | 60 days supply | Qty: 60 | Fill #0

## 2020-01-23 MED FILL — GABAPENTIN 400 MG CAPSULE: 400 | 30 days supply | Qty: 60 | Fill #0

## 2020-01-24 MED FILL — BACLOFEN 20 MG TABS: 20 | 90 days supply | Qty: 270 | Fill #0

## 2020-02-01 MED FILL — FLUTICASONE PROP 50 MCG SPR: 50 | 60 days supply | Qty: 16 | Fill #3

## 2020-02-19 ENCOUNTER — Other Ambulatory Visit (HOSPITAL_COMMUNITY): Payer: Self-pay | Admitting: Internal Medicine

## 2020-02-20 MED FILL — OMEPRAZOLE 40 MG CPDR: 40 | 30 days supply | Qty: 30 | Fill #0

## 2020-02-28 MED FILL — LARIN 1.5 MG-30 MCG TABLET: 1.5-30 | 63 days supply | Qty: 63 | Fill #2

## 2020-03-06 ENCOUNTER — Other Ambulatory Visit (HOSPITAL_COMMUNITY): Payer: Self-pay | Admitting: Nurse Practitioner

## 2020-03-06 DIAGNOSIS — M170 Bilateral primary osteoarthritis of knee: Secondary | ICD-10-CM | POA: Diagnosis not present

## 2020-03-06 DIAGNOSIS — E782 Mixed hyperlipidemia: Secondary | ICD-10-CM | POA: Diagnosis not present

## 2020-03-06 DIAGNOSIS — Z79899 Other long term (current) drug therapy: Secondary | ICD-10-CM | POA: Diagnosis not present

## 2020-03-06 DIAGNOSIS — E038 Other specified hypothyroidism: Secondary | ICD-10-CM | POA: Diagnosis not present

## 2020-03-06 DIAGNOSIS — M79642 Pain in left hand: Secondary | ICD-10-CM | POA: Diagnosis not present

## 2020-03-06 DIAGNOSIS — M79641 Pain in right hand: Secondary | ICD-10-CM | POA: Diagnosis not present

## 2020-03-06 MED FILL — EZETIMIBE 10 MG TABS: 10 | 30 days supply | Qty: 30 | Fill #0

## 2020-03-15 DIAGNOSIS — U071 COVID-19: Secondary | ICD-10-CM | POA: Diagnosis not present

## 2020-03-15 DIAGNOSIS — B974 Respiratory syncytial virus as the cause of diseases classified elsewhere: Secondary | ICD-10-CM | POA: Diagnosis not present

## 2020-03-15 DIAGNOSIS — Z20828 Contact with and (suspected) exposure to other viral communicable diseases: Secondary | ICD-10-CM | POA: Diagnosis not present

## 2020-03-15 DIAGNOSIS — J101 Influenza due to other identified influenza virus with other respiratory manifestations: Secondary | ICD-10-CM | POA: Diagnosis not present

## 2020-03-20 ENCOUNTER — Encounter: Payer: Self-pay | Admitting: Orthopaedic Surgery

## 2020-03-20 ENCOUNTER — Ambulatory Visit (INDEPENDENT_AMBULATORY_CARE_PROVIDER_SITE_OTHER): Payer: 59 | Admitting: Orthopaedic Surgery

## 2020-03-20 DIAGNOSIS — M79641 Pain in right hand: Secondary | ICD-10-CM | POA: Diagnosis not present

## 2020-03-20 DIAGNOSIS — M79642 Pain in left hand: Secondary | ICD-10-CM | POA: Diagnosis not present

## 2020-03-20 NOTE — Progress Notes (Signed)
Office Visit Note   Patient: Brenda Ruiz           Date of Birth: Mar 10, 1976           MRN: [id number] Visit Date: 03/20/2020              Requested by: Brenda Music, NP [address] [place],  [place] [address] PCP: Brenda Music, NP   Assessment & Plan: Visit Diagnoses:  1. Pain in both hands     Plan: Impression is bilateral hand pain.  My concern is that she is has osteoarthritis as well as right de Quervain's tenosynovitis.  I have recommended Voltaren gel as well as a thumb spica brace for the de Quervain's.  She will continue to take Tylenol as needed.  Follow-up as needed.  Follow-Up Instructions: Return if symptoms worsen or fail to improve.   Orders:  No orders of the defined types were placed in this encounter.  No orders of the defined types were placed in this encounter.     Procedures: No procedures performed   Clinical Data: No additional findings.   Subjective: Chief Complaint  Patient presents with  . Left Hand - Numbness, Pain  . Right Hand - Numbness, Pain    [name] is a 44 year old female comes in for evaluation of bilateral hand pain.  She has had 3 to 4 months of swelling in her both her hands.  She states that her pain is along the radial side of her right wrist and she wakes up with her hands being swollen and stiff.  She works from home and will is on the computer a lot.  She states that overall the symptoms do not feel like her carpal tunnel syndrome.  She is unable to take NSAIDs due to having only one kidney.  She did previously take a prednisone Dosepak which gave her temporary relief.  She had blood work done at her PCP which was negative for inflammatory arthropathies.   Review of Systems  Constitutional: Negative.   HENT: Negative.   Eyes: Negative.   Respiratory: Negative.   Cardiovascular: Negative.   Endocrine: Negative.   Musculoskeletal: Negative.   Neurological: Negative.   Hematological: Negative.    Psychiatric/Behavioral: Negative.   All other systems reviewed and are negative.    Objective: Vital Signs: There were no vitals taken for this visit.  Physical Exam Vitals and nursing note reviewed.  Constitutional:      Appearance: She is well-developed.  Pulmonary:     Effort: Pulmonary effort is normal.  Skin:    General: Skin is warm.     Capillary Refill: Capillary refill takes less than 2 seconds.  Neurological:     Mental Status: She is alert and oriented to person, place, and time.  Psychiatric:        Behavior: Behavior normal.        Thought Content: Thought content normal.        Judgment: Judgment normal.     Ortho Exam  Right hand and wrist show a mildly positive Finkelstein's.  She is tender along the radial styloid.  There is no tendon subluxation.  Negative carpal tunnel compressive signs.  Well-healed surgical scars. Specialty Comments:  No specialty comments available.  Imaging: No results found.   PMFS History: Patient Active Problem List   Diagnosis Date Noted  . Vitamin D deficiency   . Panic disorder (episodic paroxysmal anxiety)   . Other vitamin B12 deficiency anemias   .  Morbid obesity due to excess calories (HCC)   . Mixed irritable bowel syndrome   . Mixed hyperlipidemia   . Interstitial cystitis (chronic) without hematuria   . GERD without esophagitis   . Endometriosis   . Cough   . Chronic pain syndrome   . Chronic otitis media   . BMI 45.0-49.9, adult (HCC)   . Bilateral primary osteoarthritis of knee   . Anxiety disorder, unspecified   . Solitary right kidney   . Trigeminal neuralgia   . Hypothyroid    Past Medical History:  Diagnosis Date  . Acute upper respiratory infection, unspecified   . Anxiety   . Anxiety disorder, unspecified   . Arthritis   . Bilateral primary osteoarthritis of knee    right   . Blood transfusion without reported diagnosis 1988   Left nephrectomy  . BMI 45.0-49.9, adult (HCC)   . Chronic  otitis media    tubes in ears  . Chronic pain syndrome   . Cough   . Depression   . Endometriosis   . Fever, unspecified   . Fibromyalgia   . GERD without esophagitis   . Hypothyroid   . IBS (irritable bowel syndrome)   . Interstitial cystitis (chronic) without hematuria   . Mixed hyperlipidemia   . Mixed irritable bowel syndrome   . Morbid obesity due to excess calories (HCC)   . Other vitamin B12 deficiency anemias   . Panic disorder (episodic paroxysmal anxiety)   . Solitary right kidney   . TMJ (temporomandibular joint disorder)   . Trigeminal neuralgia   . Trigeminal neuralgia   . Vitamin D deficiency     Family History  Problem Relation Age of Onset  . Hypertension Mother   . Hyperlipidemia Mother   . Stroke Maternal Grandmother   . Diabetes type II Maternal Grandfather   . Hypertension Maternal Grandfather   . Neuropathy Neg Hx   . Colon cancer Neg Hx   . Esophageal cancer Neg Hx     Past Surgical History:  Procedure Laterality Date  . bladder stent  1999  . carpel tunnel releae Right Mardh 2020  . NEPHRECTOMY Left 1989  . tubes in ears  06/2017   Social History   Occupational History  . Not on file  Tobacco Use  . Smoking status: Former Games developer  . Smokeless tobacco: Never Used  Vaping Use  . Vaping Use: Never used  Substance and Sexual Activity  . Alcohol use: Yes    Comment: ocassionally/1-2 times a week   . Drug use: Not Currently  . Sexual activity: Not on file

## 2020-04-02 DIAGNOSIS — B974 Respiratory syncytial virus as the cause of diseases classified elsewhere: Secondary | ICD-10-CM | POA: Diagnosis not present

## 2020-04-02 DIAGNOSIS — J101 Influenza due to other identified influenza virus with other respiratory manifestations: Secondary | ICD-10-CM | POA: Diagnosis not present

## 2020-04-02 DIAGNOSIS — U071 COVID-19: Secondary | ICD-10-CM | POA: Diagnosis not present

## 2020-04-02 DIAGNOSIS — Z20828 Contact with and (suspected) exposure to other viral communicable diseases: Secondary | ICD-10-CM | POA: Diagnosis not present

## 2020-04-03 MED FILL — HYDROXYZINE PAMOATE 25 MG C: 25 | 90 days supply | Qty: 180 | Fill #0

## 2020-04-03 MED FILL — SERTRALINE HCL 100 MG TABS: 100 | 90 days supply | Qty: 180 | Fill #1

## 2020-04-03 MED FILL — OMEPRAZOLE 40 MG CPDR: 40 | 30 days supply | Qty: 30 | Fill #1

## 2020-04-09 DIAGNOSIS — B974 Respiratory syncytial virus as the cause of diseases classified elsewhere: Secondary | ICD-10-CM | POA: Diagnosis not present

## 2020-04-09 DIAGNOSIS — U071 COVID-19: Secondary | ICD-10-CM | POA: Diagnosis not present

## 2020-04-09 DIAGNOSIS — J101 Influenza due to other identified influenza virus with other respiratory manifestations: Secondary | ICD-10-CM | POA: Diagnosis not present

## 2020-04-09 DIAGNOSIS — Z20828 Contact with and (suspected) exposure to other viral communicable diseases: Secondary | ICD-10-CM | POA: Diagnosis not present

## 2020-04-15 MED FILL — GABAPENTIN 600 MG TABLET: 600 | 60 days supply | Qty: 60 | Fill #0

## 2020-04-15 MED FILL — EZETIMIBE 10 MG TABS: 10 | 30 days supply | Qty: 30 | Fill #1

## 2020-04-22 MED FILL — GABAPENTIN 400 MG CAPSULE: 400 | 30 days supply | Qty: 60 | Fill #1

## 2020-04-22 MED FILL — VIT D2 1.25 MG (50,000 UNIT: 1.25 MG | 28 days supply | Qty: 4 | Fill #0

## 2020-04-23 MED FILL — DICYCLOMINE 10 MG CAPSULE: 10 | 30 days supply | Qty: 120 | Fill #0

## 2020-04-30 ENCOUNTER — Other Ambulatory Visit (HOSPITAL_COMMUNITY): Payer: Self-pay | Admitting: Nurse Practitioner

## 2020-04-30 DIAGNOSIS — K582 Mixed irritable bowel syndrome: Secondary | ICD-10-CM | POA: Diagnosis not present

## 2020-05-06 MED FILL — OMEPRAZOLE 40 MG CPDR: 40 | 30 days supply | Qty: 30 | Fill #2

## 2020-05-14 ENCOUNTER — Other Ambulatory Visit (HOSPITAL_COMMUNITY): Payer: Self-pay | Admitting: Nurse Practitioner

## 2020-05-14 MED FILL — LARIN 1.5 MG-30 MCG TABLET: 1.5-30 | 63 days supply | Qty: 63 | Fill #0

## 2020-05-16 ENCOUNTER — Other Ambulatory Visit: Payer: Self-pay | Admitting: Nurse Practitioner

## 2020-05-16 DIAGNOSIS — Z1231 Encounter for screening mammogram for malignant neoplasm of breast: Secondary | ICD-10-CM

## 2020-05-22 ENCOUNTER — Other Ambulatory Visit: Payer: Self-pay | Admitting: Nurse Practitioner

## 2020-05-22 DIAGNOSIS — Z1231 Encounter for screening mammogram for malignant neoplasm of breast: Secondary | ICD-10-CM

## 2020-05-28 ENCOUNTER — Other Ambulatory Visit (HOSPITAL_COMMUNITY): Payer: Self-pay | Admitting: Internal Medicine

## 2020-05-28 MED FILL — EZETIMIBE 10 MG TABS: 10 | 30 days supply | Qty: 30 | Fill #2

## 2020-05-28 MED FILL — LEVOTHYROXINE SODIUM 25 MCG: 25 | 30 days supply | Qty: 30 | Fill #1

## 2020-05-28 MED FILL — BACLOFEN 20 MG TABS: 20 | 90 days supply | Qty: 270 | Fill #0

## 2020-06-06 ENCOUNTER — Other Ambulatory Visit (HOSPITAL_COMMUNITY): Payer: Self-pay | Admitting: Internal Medicine

## 2020-06-06 MED FILL — VIT D2 1.25 MG (50,000 UNIT: 1.25 MG | 28 days supply | Qty: 4 | Fill #1

## 2020-06-06 MED FILL — OMEPRAZOLE 40 MG CPDR: 40 | 30 days supply | Qty: 30 | Fill #0

## 2020-06-18 ENCOUNTER — Other Ambulatory Visit (HOSPITAL_COMMUNITY): Payer: Self-pay | Admitting: Nurse Practitioner

## 2020-06-18 ENCOUNTER — Ambulatory Visit: Payer: 59

## 2020-06-18 MED FILL — GABAPENTIN 600 MG TABLET: 600 | 60 days supply | Qty: 60 | Fill #0

## 2020-06-18 MED FILL — GABAPENTIN 400 MG CAPSULE: 400 | 30 days supply | Qty: 60 | Fill #0

## 2020-06-25 ENCOUNTER — Other Ambulatory Visit (HOSPITAL_COMMUNITY): Payer: Self-pay | Admitting: Nurse Practitioner

## 2020-06-25 DIAGNOSIS — E038 Other specified hypothyroidism: Secondary | ICD-10-CM | POA: Diagnosis not present

## 2020-06-25 DIAGNOSIS — M19049 Primary osteoarthritis, unspecified hand: Secondary | ICD-10-CM | POA: Diagnosis not present

## 2020-06-25 DIAGNOSIS — G894 Chronic pain syndrome: Secondary | ICD-10-CM | POA: Diagnosis not present

## 2020-06-25 DIAGNOSIS — M170 Bilateral primary osteoarthritis of knee: Secondary | ICD-10-CM | POA: Diagnosis not present

## 2020-06-25 DIAGNOSIS — R5382 Chronic fatigue, unspecified: Secondary | ICD-10-CM | POA: Diagnosis not present

## 2020-06-25 DIAGNOSIS — K582 Mixed irritable bowel syndrome: Secondary | ICD-10-CM | POA: Diagnosis not present

## 2020-06-25 DIAGNOSIS — G9009 Other idiopathic peripheral autonomic neuropathy: Secondary | ICD-10-CM | POA: Diagnosis not present

## 2020-06-25 DIAGNOSIS — E782 Mixed hyperlipidemia: Secondary | ICD-10-CM | POA: Diagnosis not present

## 2020-06-25 DIAGNOSIS — G5603 Carpal tunnel syndrome, bilateral upper limbs: Secondary | ICD-10-CM | POA: Diagnosis not present

## 2020-06-25 MED FILL — DICLOFENAC SODIUM 1 % GEL: 1 | 30 days supply | Qty: 100 | Fill #0

## 2020-07-04 ENCOUNTER — Other Ambulatory Visit (HOSPITAL_COMMUNITY): Payer: Self-pay | Admitting: Nurse Practitioner

## 2020-07-04 MED FILL — FLUTICASONE PROP 50 MCG SPR: 50 | 60 days supply | Qty: 16 | Fill #4

## 2020-07-04 MED FILL — OMEPRAZOLE 40 MG CPDR: 40 | 30 days supply | Qty: 30 | Fill #1

## 2020-07-04 MED FILL — VIT D2 1.25 MG (50,000 UNIT: 1.25 MG | 28 days supply | Qty: 4 | Fill #2

## 2020-07-04 MED FILL — EZETIMIBE 10 MG TABS: 10 | 30 days supply | Qty: 30 | Fill #0

## 2020-07-04 MED FILL — LEVOTHYROXINE SODIUM 25 MCG: 25 | 30 days supply | Qty: 30 | Fill #0

## 2020-07-11 MED FILL — DICYCLOMINE 20 MG TABLET: 20 | 30 days supply | Qty: 90 | Fill #0

## 2020-07-23 MED FILL — LARIN 1.5 MG-30 MCG TABLET: 1.5-30 | 63 days supply | Qty: 63 | Fill #1

## 2020-07-26 DIAGNOSIS — R5382 Chronic fatigue, unspecified: Secondary | ICD-10-CM | POA: Diagnosis not present

## 2020-07-26 DIAGNOSIS — G894 Chronic pain syndrome: Secondary | ICD-10-CM | POA: Diagnosis not present

## 2020-07-26 DIAGNOSIS — M19049 Primary osteoarthritis, unspecified hand: Secondary | ICD-10-CM | POA: Diagnosis not present

## 2020-07-26 DIAGNOSIS — G9009 Other idiopathic peripheral autonomic neuropathy: Secondary | ICD-10-CM | POA: Diagnosis not present

## 2020-07-26 DIAGNOSIS — M170 Bilateral primary osteoarthritis of knee: Secondary | ICD-10-CM | POA: Diagnosis not present

## 2020-07-26 DIAGNOSIS — G5603 Carpal tunnel syndrome, bilateral upper limbs: Secondary | ICD-10-CM | POA: Diagnosis not present

## 2020-07-27 DIAGNOSIS — G5603 Carpal tunnel syndrome, bilateral upper limbs: Secondary | ICD-10-CM | POA: Diagnosis not present

## 2020-07-27 DIAGNOSIS — M170 Bilateral primary osteoarthritis of knee: Secondary | ICD-10-CM | POA: Diagnosis not present

## 2020-07-27 DIAGNOSIS — R5382 Chronic fatigue, unspecified: Secondary | ICD-10-CM | POA: Diagnosis not present

## 2020-07-27 DIAGNOSIS — M19049 Primary osteoarthritis, unspecified hand: Secondary | ICD-10-CM | POA: Diagnosis not present

## 2020-07-27 DIAGNOSIS — G9009 Other idiopathic peripheral autonomic neuropathy: Secondary | ICD-10-CM | POA: Diagnosis not present

## 2020-07-27 DIAGNOSIS — G894 Chronic pain syndrome: Secondary | ICD-10-CM | POA: Diagnosis not present

## 2020-08-01 ENCOUNTER — Ambulatory Visit: Payer: 59

## 2020-08-05 MED FILL — OMEPRAZOLE 40 MG CPDR: 40 | 30 days supply | Qty: 30 | Fill #2

## 2020-08-05 MED FILL — DICLOFENAC SODIUM 1 % GEL: 1 | 30 days supply | Qty: 100 | Fill #1

## 2020-08-08 DIAGNOSIS — B974 Respiratory syncytial virus as the cause of diseases classified elsewhere: Secondary | ICD-10-CM | POA: Diagnosis not present

## 2020-08-08 DIAGNOSIS — J101 Influenza due to other identified influenza virus with other respiratory manifestations: Secondary | ICD-10-CM | POA: Diagnosis not present

## 2020-08-08 DIAGNOSIS — U071 COVID-19: Secondary | ICD-10-CM | POA: Diagnosis not present

## 2020-08-08 DIAGNOSIS — Z20828 Contact with and (suspected) exposure to other viral communicable diseases: Secondary | ICD-10-CM | POA: Diagnosis not present

## 2020-08-10 DIAGNOSIS — M170 Bilateral primary osteoarthritis of knee: Secondary | ICD-10-CM | POA: Diagnosis not present

## 2020-08-10 DIAGNOSIS — G894 Chronic pain syndrome: Secondary | ICD-10-CM | POA: Diagnosis not present

## 2020-08-10 DIAGNOSIS — G5603 Carpal tunnel syndrome, bilateral upper limbs: Secondary | ICD-10-CM | POA: Diagnosis not present

## 2020-08-10 DIAGNOSIS — G9009 Other idiopathic peripheral autonomic neuropathy: Secondary | ICD-10-CM | POA: Diagnosis not present

## 2020-08-10 DIAGNOSIS — R5382 Chronic fatigue, unspecified: Secondary | ICD-10-CM | POA: Diagnosis not present

## 2020-08-10 DIAGNOSIS — M19049 Primary osteoarthritis, unspecified hand: Secondary | ICD-10-CM | POA: Diagnosis not present

## 2020-08-14 ENCOUNTER — Other Ambulatory Visit (HOSPITAL_COMMUNITY): Payer: Self-pay | Admitting: Family

## 2020-08-14 MED FILL — VIT D2 1.25 MG (50,000 UNIT: 1.25 MG | 28 days supply | Qty: 4 | Fill #0

## 2020-08-14 MED FILL — LEVOTHYROXINE SODIUM 25 MCG: 25 | 30 days supply | Qty: 30 | Fill #1

## 2020-08-14 MED FILL — EZETIMIBE 10 MG TABS: 10 | 30 days supply | Qty: 30 | Fill #1

## 2020-08-14 MED FILL — LEVOTHYROXINE SODIUM 200 MC: 200 | 30 days supply | Qty: 30 | Fill #0

## 2020-08-14 MED FILL — CYANOCOBALAMIN 1,000 MCG/ML: 1000 | 28 days supply | Qty: 4 | Fill #0

## 2020-08-27 DIAGNOSIS — R5382 Chronic fatigue, unspecified: Secondary | ICD-10-CM | POA: Diagnosis not present

## 2020-08-27 DIAGNOSIS — M19049 Primary osteoarthritis, unspecified hand: Secondary | ICD-10-CM | POA: Diagnosis not present

## 2020-08-27 DIAGNOSIS — G894 Chronic pain syndrome: Secondary | ICD-10-CM | POA: Diagnosis not present

## 2020-08-27 DIAGNOSIS — G5603 Carpal tunnel syndrome, bilateral upper limbs: Secondary | ICD-10-CM | POA: Diagnosis not present

## 2020-08-27 DIAGNOSIS — M170 Bilateral primary osteoarthritis of knee: Secondary | ICD-10-CM | POA: Diagnosis not present

## 2020-08-27 DIAGNOSIS — G9009 Other idiopathic peripheral autonomic neuropathy: Secondary | ICD-10-CM | POA: Diagnosis not present

## 2020-08-29 ENCOUNTER — Other Ambulatory Visit: Payer: Self-pay

## 2020-08-29 ENCOUNTER — Ambulatory Visit
Admission: RE | Admit: 2020-08-29 | Discharge: 2020-08-29 | Disposition: A | Discharge status: Home / Self Care | Payer: 59 | Source: Ambulatory Visit | Attending: Nurse Practitioner | Admitting: Nurse Practitioner

## 2020-08-29 DIAGNOSIS — Z1231 Encounter for screening mammogram for malignant neoplasm of breast: Secondary | ICD-10-CM | POA: Diagnosis not present

## 2020-09-09 ENCOUNTER — Other Ambulatory Visit (HOSPITAL_COMMUNITY): Payer: Self-pay | Admitting: Internal Medicine

## 2020-09-09 MED FILL — OMEPRAZOLE 40 MG CPDR: 40 | 30 days supply | Qty: 30 | Fill #0

## 2020-09-10 DIAGNOSIS — G894 Chronic pain syndrome: Secondary | ICD-10-CM | POA: Diagnosis not present

## 2020-09-10 DIAGNOSIS — M170 Bilateral primary osteoarthritis of knee: Secondary | ICD-10-CM | POA: Diagnosis not present

## 2020-09-10 DIAGNOSIS — G5603 Carpal tunnel syndrome, bilateral upper limbs: Secondary | ICD-10-CM | POA: Diagnosis not present

## 2020-09-10 DIAGNOSIS — R5382 Chronic fatigue, unspecified: Secondary | ICD-10-CM | POA: Diagnosis not present

## 2020-09-10 DIAGNOSIS — G9009 Other idiopathic peripheral autonomic neuropathy: Secondary | ICD-10-CM | POA: Diagnosis not present

## 2020-09-10 DIAGNOSIS — M19049 Primary osteoarthritis, unspecified hand: Secondary | ICD-10-CM | POA: Diagnosis not present

## 2020-09-18 ENCOUNTER — Other Ambulatory Visit (HOSPITAL_COMMUNITY): Payer: Self-pay | Admitting: Nurse Practitioner

## 2020-09-18 MED FILL — GABAPENTIN 400 MG CAPSULE: 400 | 30 days supply | Qty: 60 | Fill #0

## 2020-09-18 MED FILL — VIT D2 1.25 MG (50,000 UNIT: 1.25 MG | 28 days supply | Qty: 4 | Fill #0

## 2020-09-18 MED FILL — GABAPENTIN 600 MG TABLET: 600 | 30 days supply | Qty: 30 | Fill #0

## 2020-09-18 MED FILL — PHENAZOPYRIDINE 200 MG TAB: 200 | 2 days supply | Qty: 6 | Fill #0

## 2020-09-18 MED FILL — HYDROXYZINE PAMOATE 25 MG C: 25 | 30 days supply | Qty: 60 | Fill #0

## 2020-09-24 ENCOUNTER — Other Ambulatory Visit (HOSPITAL_COMMUNITY): Payer: Self-pay | Admitting: Nurse Practitioner

## 2020-09-24 DIAGNOSIS — E038 Other specified hypothyroidism: Secondary | ICD-10-CM | POA: Diagnosis not present

## 2020-09-24 DIAGNOSIS — M170 Bilateral primary osteoarthritis of knee: Secondary | ICD-10-CM | POA: Diagnosis not present

## 2020-09-24 DIAGNOSIS — R5382 Chronic fatigue, unspecified: Secondary | ICD-10-CM | POA: Diagnosis not present

## 2020-09-24 DIAGNOSIS — G5603 Carpal tunnel syndrome, bilateral upper limbs: Secondary | ICD-10-CM | POA: Diagnosis not present

## 2020-09-24 DIAGNOSIS — D518 Other vitamin B12 deficiency anemias: Secondary | ICD-10-CM | POA: Diagnosis not present

## 2020-09-24 DIAGNOSIS — I10 Essential (primary) hypertension: Secondary | ICD-10-CM | POA: Diagnosis not present

## 2020-09-24 DIAGNOSIS — M19049 Primary osteoarthritis, unspecified hand: Secondary | ICD-10-CM | POA: Diagnosis not present

## 2020-09-24 DIAGNOSIS — G9009 Other idiopathic peripheral autonomic neuropathy: Secondary | ICD-10-CM | POA: Diagnosis not present

## 2020-09-24 DIAGNOSIS — E782 Mixed hyperlipidemia: Secondary | ICD-10-CM | POA: Diagnosis not present

## 2020-09-24 DIAGNOSIS — E559 Vitamin D deficiency, unspecified: Secondary | ICD-10-CM | POA: Diagnosis not present

## 2020-09-24 DIAGNOSIS — G894 Chronic pain syndrome: Secondary | ICD-10-CM | POA: Diagnosis not present

## 2020-09-24 DIAGNOSIS — E119 Type 2 diabetes mellitus without complications: Secondary | ICD-10-CM | POA: Diagnosis not present

## 2020-09-24 MED FILL — LEVOTHYROXINE SODIUM 25 MCG: 25 | 30 days supply | Qty: 30 | Fill #0

## 2020-09-27 ENCOUNTER — Other Ambulatory Visit (HOSPITAL_COMMUNITY): Payer: Self-pay | Admitting: Internal Medicine

## 2020-09-27 MED FILL — LEVOTHYROXINE SODIUM 200 MC: 200 | 30 days supply | Qty: 30 | Fill #0

## 2020-09-30 MED FILL — EZETIMIBE 10 MG TABS: 10 | 30 days supply | Qty: 30 | Fill #2

## 2020-10-01 ENCOUNTER — Other Ambulatory Visit (HOSPITAL_COMMUNITY): Payer: Self-pay | Admitting: Nurse Practitioner

## 2020-10-01 MED FILL — SERTRALINE HCL 100 MG TABS: 100 | 90 days supply | Qty: 180 | Fill #0

## 2020-10-08 DIAGNOSIS — M170 Bilateral primary osteoarthritis of knee: Secondary | ICD-10-CM | POA: Diagnosis not present

## 2020-10-08 DIAGNOSIS — G9009 Other idiopathic peripheral autonomic neuropathy: Secondary | ICD-10-CM | POA: Diagnosis not present

## 2020-10-08 DIAGNOSIS — M19049 Primary osteoarthritis, unspecified hand: Secondary | ICD-10-CM | POA: Diagnosis not present

## 2020-10-08 DIAGNOSIS — R5382 Chronic fatigue, unspecified: Secondary | ICD-10-CM | POA: Diagnosis not present

## 2020-10-08 DIAGNOSIS — G894 Chronic pain syndrome: Secondary | ICD-10-CM | POA: Diagnosis not present

## 2020-10-08 DIAGNOSIS — G5603 Carpal tunnel syndrome, bilateral upper limbs: Secondary | ICD-10-CM | POA: Diagnosis not present

## 2020-10-14 MED FILL — OMEPRAZOLE 40 MG CPDR: 40 | 30 days supply | Qty: 30 | Fill #1

## 2020-10-15 ENCOUNTER — Other Ambulatory Visit (HOSPITAL_BASED_OUTPATIENT_CLINIC_OR_DEPARTMENT_OTHER): Payer: Self-pay

## 2020-10-15 DIAGNOSIS — M79641 Pain in right hand: Secondary | ICD-10-CM | POA: Diagnosis not present

## 2020-10-15 DIAGNOSIS — M778 Other enthesopathies, not elsewhere classified: Secondary | ICD-10-CM | POA: Diagnosis not present

## 2020-10-25 DIAGNOSIS — M19049 Primary osteoarthritis, unspecified hand: Secondary | ICD-10-CM | POA: Diagnosis not present

## 2020-10-25 DIAGNOSIS — G894 Chronic pain syndrome: Secondary | ICD-10-CM | POA: Diagnosis not present

## 2020-10-25 DIAGNOSIS — M170 Bilateral primary osteoarthritis of knee: Secondary | ICD-10-CM | POA: Diagnosis not present

## 2020-10-25 DIAGNOSIS — R5382 Chronic fatigue, unspecified: Secondary | ICD-10-CM | POA: Diagnosis not present

## 2020-10-25 DIAGNOSIS — G9009 Other idiopathic peripheral autonomic neuropathy: Secondary | ICD-10-CM | POA: Diagnosis not present

## 2020-10-25 DIAGNOSIS — G5603 Carpal tunnel syndrome, bilateral upper limbs: Secondary | ICD-10-CM | POA: Diagnosis not present

## 2020-10-25 MED FILL — LEVOTHYROXINE SODIUM 25 MCG: 25 | 30 days supply | Qty: 30 | Fill #1

## 2020-10-25 MED FILL — LEVOTHYROXINE SODIUM 200 MC: 200 | 30 days supply | Qty: 30 | Fill #1

## 2020-10-25 MED FILL — VIT D2 1.25 MG (50,000 UNIT: 1.25 MG | 28 days supply | Qty: 4 | Fill #1

## 2020-10-25 MED FILL — LARIN 1.5 MG-30 MCG TABLET: 1.5-30 | 63 days supply | Qty: 63 | Fill #1

## 2020-11-04 ENCOUNTER — Other Ambulatory Visit (HOSPITAL_COMMUNITY): Payer: Self-pay

## 2020-11-04 MED FILL — Hydroxyzine Pamoate Cap 25 MG: ORAL | 30 days supply | Qty: 60 | Fill #0 | Status: AC

## 2020-11-04 MED FILL — Fluticasone Propionate Nasal Susp 50 MCG/ACT: NASAL | 60 days supply | Qty: 16 | Fill #0 | Status: AC

## 2020-11-04 MED FILL — Gabapentin Tab 600 MG: ORAL | 30 days supply | Qty: 30 | Fill #0 | Status: AC

## 2020-11-04 MED FILL — Gabapentin Cap 400 MG: ORAL | 30 days supply | Qty: 60 | Fill #0 | Status: AC

## 2020-11-08 ENCOUNTER — Other Ambulatory Visit (HOSPITAL_COMMUNITY): Payer: Self-pay

## 2020-11-08 MED FILL — Dicyclomine HCl Tab 20 MG: ORAL | 30 days supply | Qty: 90 | Fill #0 | Status: AC

## 2020-11-08 MED FILL — Diclofenac Sodium Gel 1% (1.16% Diethylamine Equiv): CUTANEOUS | 25 days supply | Qty: 100 | Fill #0 | Status: AC

## 2020-11-11 ENCOUNTER — Other Ambulatory Visit (HOSPITAL_COMMUNITY): Payer: Self-pay

## 2020-11-11 MED ORDER — BACLOFEN 20 MG PO TABS
ORAL_TABLET | ORAL | 0 refills | Status: DC
  Filled 2020-11-11: qty 270, 90d supply, fill #0

## 2020-11-11 MED ORDER — DICYCLOMINE HCL 20 MG PO TABS
ORAL_TABLET | ORAL | 0 refills | Status: DC
  Filled 2020-11-11 – 2021-01-03 (×2): qty 90, 30d supply, fill #0

## 2020-11-12 ENCOUNTER — Other Ambulatory Visit (HOSPITAL_COMMUNITY): Payer: Self-pay

## 2020-11-12 MED ORDER — EZETIMIBE 10 MG PO TABS
ORAL_TABLET | ORAL | 0 refills | Status: DC
Start: 2020-11-11 — End: 2020-12-18
  Filled 2020-11-12: qty 30, 30d supply, fill #0

## 2020-11-21 ENCOUNTER — Other Ambulatory Visit (HOSPITAL_COMMUNITY): Payer: Self-pay

## 2020-11-21 MED FILL — Levothyroxine Sodium Tab 25 MCG: ORAL | 30 days supply | Qty: 30 | Fill #0 | Status: AC

## 2020-11-21 MED FILL — Ergocalciferol Cap 1.25 MG (50000 Unit): ORAL | 28 days supply | Qty: 4 | Fill #0 | Status: AC

## 2020-11-28 ENCOUNTER — Other Ambulatory Visit (HOSPITAL_COMMUNITY): Payer: Self-pay

## 2020-11-28 MED FILL — Omeprazole Cap Delayed Release 40 MG: ORAL | 30 days supply | Qty: 30 | Fill #0 | Status: AC

## 2020-12-07 ENCOUNTER — Other Ambulatory Visit (HOSPITAL_COMMUNITY): Payer: Self-pay

## 2020-12-07 MED FILL — Levothyroxine Sodium Tab 200 MCG: ORAL | 30 days supply | Qty: 30 | Fill #0 | Status: AC

## 2020-12-07 MED FILL — Gabapentin Tab 600 MG: ORAL | 30 days supply | Qty: 30 | Fill #1 | Status: AC

## 2020-12-07 MED FILL — Hydroxyzine Pamoate Cap 25 MG: ORAL | 30 days supply | Qty: 60 | Fill #1 | Status: AC

## 2020-12-18 ENCOUNTER — Other Ambulatory Visit (HOSPITAL_COMMUNITY): Payer: Self-pay

## 2020-12-18 MED ORDER — EZETIMIBE 10 MG PO TABS
10.0000 mg | ORAL_TABLET | Freq: Every day | ORAL | 0 refills | Status: DC
  Filled 2020-12-18: qty 30, 30d supply, fill #0

## 2020-12-18 MED FILL — Gabapentin Cap 400 MG: ORAL | 30 days supply | Qty: 60 | Fill #1 | Status: AC

## 2020-12-30 ENCOUNTER — Other Ambulatory Visit (HOSPITAL_COMMUNITY): Payer: Self-pay

## 2020-12-30 MED FILL — Ergocalciferol Cap 1.25 MG (50000 Unit): ORAL | 28 days supply | Qty: 4 | Fill #1 | Status: AC

## 2021-01-01 ENCOUNTER — Other Ambulatory Visit (HOSPITAL_COMMUNITY): Payer: Self-pay

## 2021-01-02 ENCOUNTER — Other Ambulatory Visit (HOSPITAL_COMMUNITY): Payer: Self-pay

## 2021-01-02 MED ORDER — OMEPRAZOLE 40 MG PO CPDR
DELAYED_RELEASE_CAPSULE | ORAL | 2 refills | Status: DC
  Filled 2021-01-02: qty 30, 30d supply, fill #0
  Filled 2021-02-05: qty 30, 30d supply, fill #1

## 2021-01-03 ENCOUNTER — Other Ambulatory Visit (HOSPITAL_COMMUNITY): Payer: Self-pay

## 2021-01-03 MED FILL — Sertraline HCl Tab 100 MG: ORAL | 90 days supply | Qty: 180 | Fill #0 | Status: AC

## 2021-01-06 ENCOUNTER — Other Ambulatory Visit (HOSPITAL_COMMUNITY): Payer: Self-pay

## 2021-01-06 MED FILL — Fluticasone Propionate Nasal Susp 50 MCG/ACT: NASAL | 60 days supply | Qty: 16 | Fill #1 | Status: AC

## 2021-01-06 MED FILL — Norethindrone Ace & Ethinyl Estradiol Tab 1.5 MG-30 MCG: ORAL | 63 days supply | Qty: 63 | Fill #0 | Status: AC

## 2021-01-07 ENCOUNTER — Other Ambulatory Visit (HOSPITAL_COMMUNITY): Payer: Self-pay

## 2021-01-07 MED ORDER — LEVOTHYROXINE SODIUM 25 MCG PO TABS
25.0000 ug | ORAL_TABLET | Freq: Every day | ORAL | 2 refills | Status: DC
  Filled 2021-01-07: qty 30, 30d supply, fill #0

## 2021-01-07 MED ORDER — HYDROXYZINE PAMOATE 25 MG PO CAPS
25.0000 mg | ORAL_CAPSULE | Freq: Two times a day (BID) | ORAL | 2 refills | Status: DC
  Filled 2021-01-07: qty 60, 30d supply, fill #0
  Filled 2021-02-11: qty 60, 30d supply, fill #1
  Filled 2021-04-01: qty 60, 30d supply, fill #2

## 2021-01-07 MED ORDER — LEVOTHYROXINE SODIUM 25 MCG PO TABS
ORAL_TABLET | ORAL | 2 refills | Status: DC
  Filled 2021-01-07: qty 30, 30d supply, fill #0
  Filled 2021-02-05: qty 30, 30d supply, fill #1

## 2021-01-08 ENCOUNTER — Telehealth: Payer: Self-pay | Admitting: Orthopaedic Surgery

## 2021-01-08 NOTE — Telephone Encounter (Signed)
Received call from patient, needs copy of ov note. I accepted verbal authorization and verified all her info. Note mailed to home address per pts reqeust [phone]

## 2021-01-09 DIAGNOSIS — M25531 Pain in right wrist: Secondary | ICD-10-CM | POA: Diagnosis not present

## 2021-01-09 DIAGNOSIS — F419 Anxiety disorder, unspecified: Secondary | ICD-10-CM | POA: Diagnosis not present

## 2021-01-09 DIAGNOSIS — M79641 Pain in right hand: Secondary | ICD-10-CM | POA: Diagnosis not present

## 2021-01-13 ENCOUNTER — Other Ambulatory Visit (HOSPITAL_COMMUNITY): Payer: Self-pay

## 2021-01-13 MED ORDER — LEVOTHYROXINE SODIUM 200 MCG PO TABS
ORAL_TABLET | ORAL | 3 refills | Status: DC
  Filled 2021-01-13: qty 30, 30d supply, fill #0
  Filled 2021-02-24: qty 30, 30d supply, fill #1
  Filled 2021-03-20: qty 30, 30d supply, fill #2
  Filled 2021-05-02: qty 30, 30d supply, fill #3

## 2021-01-13 MED ORDER — EZETIMIBE 10 MG PO TABS
10.0000 mg | ORAL_TABLET | Freq: Every day | ORAL | 1 refills | Status: DC
  Filled 2021-01-13: qty 30, 30d supply, fill #0

## 2021-01-13 MED ORDER — GABAPENTIN 600 MG PO TABS
ORAL_TABLET | ORAL | 2 refills | Status: DC
  Filled 2021-01-13: qty 30, 30d supply, fill #0
  Filled 2021-02-11: qty 30, 30d supply, fill #1
  Filled 2021-03-20: qty 30, 30d supply, fill #2

## 2021-01-14 ENCOUNTER — Other Ambulatory Visit (HOSPITAL_COMMUNITY): Payer: Self-pay

## 2021-01-14 DIAGNOSIS — Z0000 Encounter for general adult medical examination without abnormal findings: Secondary | ICD-10-CM | POA: Diagnosis not present

## 2021-01-14 DIAGNOSIS — D518 Other vitamin B12 deficiency anemias: Secondary | ICD-10-CM | POA: Diagnosis not present

## 2021-01-14 DIAGNOSIS — F419 Anxiety disorder, unspecified: Secondary | ICD-10-CM | POA: Diagnosis not present

## 2021-01-14 DIAGNOSIS — E782 Mixed hyperlipidemia: Secondary | ICD-10-CM | POA: Diagnosis not present

## 2021-01-14 MED ORDER — EZETIMIBE 10 MG PO TABS
10.0000 mg | ORAL_TABLET | Freq: Every day | ORAL | 0 refills | Status: DC
  Filled 2021-01-14 – 2021-02-24 (×2): qty 30, 30d supply, fill #0

## 2021-01-16 ENCOUNTER — Other Ambulatory Visit (HOSPITAL_COMMUNITY): Payer: Self-pay

## 2021-02-05 ENCOUNTER — Other Ambulatory Visit (HOSPITAL_COMMUNITY): Payer: Self-pay

## 2021-02-06 ENCOUNTER — Other Ambulatory Visit (HOSPITAL_COMMUNITY): Payer: Self-pay

## 2021-02-06 MED FILL — Ergocalciferol Cap 1.25 MG (50000 Unit): ORAL | 28 days supply | Qty: 4 | Fill #0 | Status: AC

## 2021-02-06 MED FILL — Gabapentin Cap 400 MG: ORAL | 30 days supply | Qty: 60 | Fill #0 | Status: AC

## 2021-02-11 ENCOUNTER — Other Ambulatory Visit (HOSPITAL_COMMUNITY): Payer: Self-pay

## 2021-02-24 ENCOUNTER — Other Ambulatory Visit (HOSPITAL_COMMUNITY): Payer: Self-pay

## 2021-03-11 ENCOUNTER — Other Ambulatory Visit (HOSPITAL_COMMUNITY): Payer: Self-pay

## 2021-03-11 DIAGNOSIS — K219 Gastro-esophageal reflux disease without esophagitis: Secondary | ICD-10-CM | POA: Diagnosis not present

## 2021-03-11 MED ORDER — OMEPRAZOLE 40 MG PO CPDR
DELAYED_RELEASE_CAPSULE | ORAL | 2 refills | Status: DC
  Filled 2021-03-11: qty 30, 30d supply, fill #0
  Filled 2021-04-14: qty 30, 30d supply, fill #1
  Filled 2021-05-13: qty 30, 30d supply, fill #2

## 2021-03-11 MED ORDER — LEVOTHYROXINE SODIUM 25 MCG PO TABS
ORAL_TABLET | ORAL | 2 refills | Status: DC
  Filled 2021-03-11: qty 30, 30d supply, fill #0
  Filled 2021-04-14: qty 30, 30d supply, fill #1
  Filled 2021-05-13: qty 30, 30d supply, fill #2

## 2021-03-20 ENCOUNTER — Other Ambulatory Visit (HOSPITAL_COMMUNITY): Payer: Self-pay

## 2021-03-20 MED ORDER — BACLOFEN 20 MG PO TABS
ORAL_TABLET | ORAL | 0 refills | Status: DC
  Filled 2021-03-20: qty 270, 90d supply, fill #0

## 2021-03-20 MED ORDER — EZETIMIBE 10 MG PO TABS
10.0000 mg | ORAL_TABLET | Freq: Every day | ORAL | 0 refills | Status: DC
  Filled 2021-03-20: qty 30, 30d supply, fill #0

## 2021-03-20 MED FILL — Fluticasone Propionate Nasal Susp 50 MCG/ACT: NASAL | 60 days supply | Qty: 16 | Fill #2 | Status: AC

## 2021-03-20 MED FILL — Ergocalciferol Cap 1.25 MG (50000 Unit): ORAL | 28 days supply | Qty: 4 | Fill #1 | Status: AC

## 2021-03-21 ENCOUNTER — Other Ambulatory Visit (HOSPITAL_COMMUNITY): Payer: Self-pay

## 2021-03-21 MED ORDER — NORETHINDRONE ACET-ETHINYL EST 1.5-30 MG-MCG PO TABS
ORAL_TABLET | ORAL | 2 refills | Status: DC
  Filled 2021-03-21: qty 63, 63d supply, fill #0

## 2021-03-21 MED ORDER — GABAPENTIN 400 MG PO CAPS
ORAL_CAPSULE | ORAL | 2 refills | Status: DC
  Filled 2021-03-21: qty 60, 30d supply, fill #0
  Filled 2021-05-13: qty 60, 30d supply, fill #1
  Filled 2021-08-11: qty 60, 30d supply, fill #2

## 2021-03-24 ENCOUNTER — Other Ambulatory Visit (HOSPITAL_COMMUNITY): Payer: Self-pay

## 2021-03-24 MED ORDER — NORETHINDRONE ACET-ETHINYL EST 1.5-30 MG-MCG PO TABS
ORAL_TABLET | ORAL | 3 refills | Status: DC
  Filled 2021-03-24: qty 63, 63d supply, fill #0
  Filled 2021-06-05: qty 84, 84d supply, fill #0
  Filled 2021-09-04 (×2): qty 84, 84d supply, fill #1
  Filled 2022-01-19: qty 84, 84d supply, fill #2

## 2021-03-25 IMAGING — MG MM DIGITAL SCREENING BILAT W/ TOMO AND CAD
6 of 10 series · 6 of 30 positions shown · non-contrast
Comparison: Previous exam(s).

CLINICAL DATA: Screening.

EXAM:
DIGITAL SCREENING BILATERAL MAMMOGRAM WITH TOMOSYNTHESIS AND CAD

[L MLO synth-2D (1 of 2)]
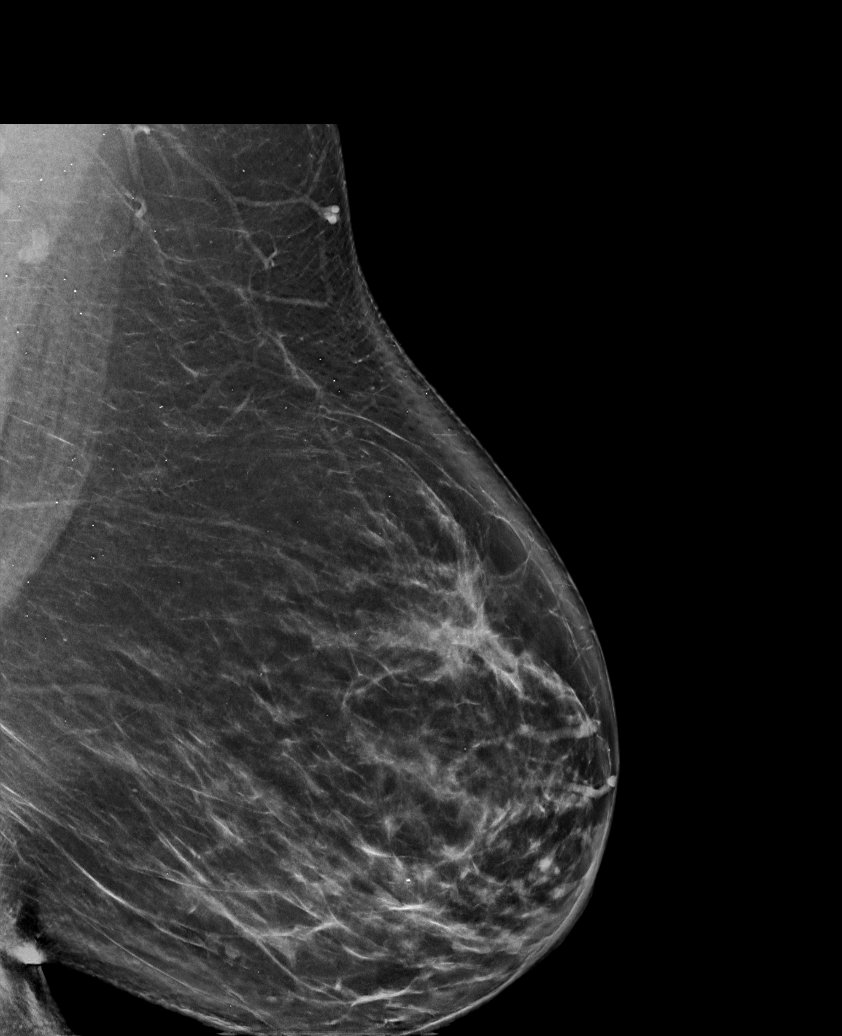

[L CC synth-2D]
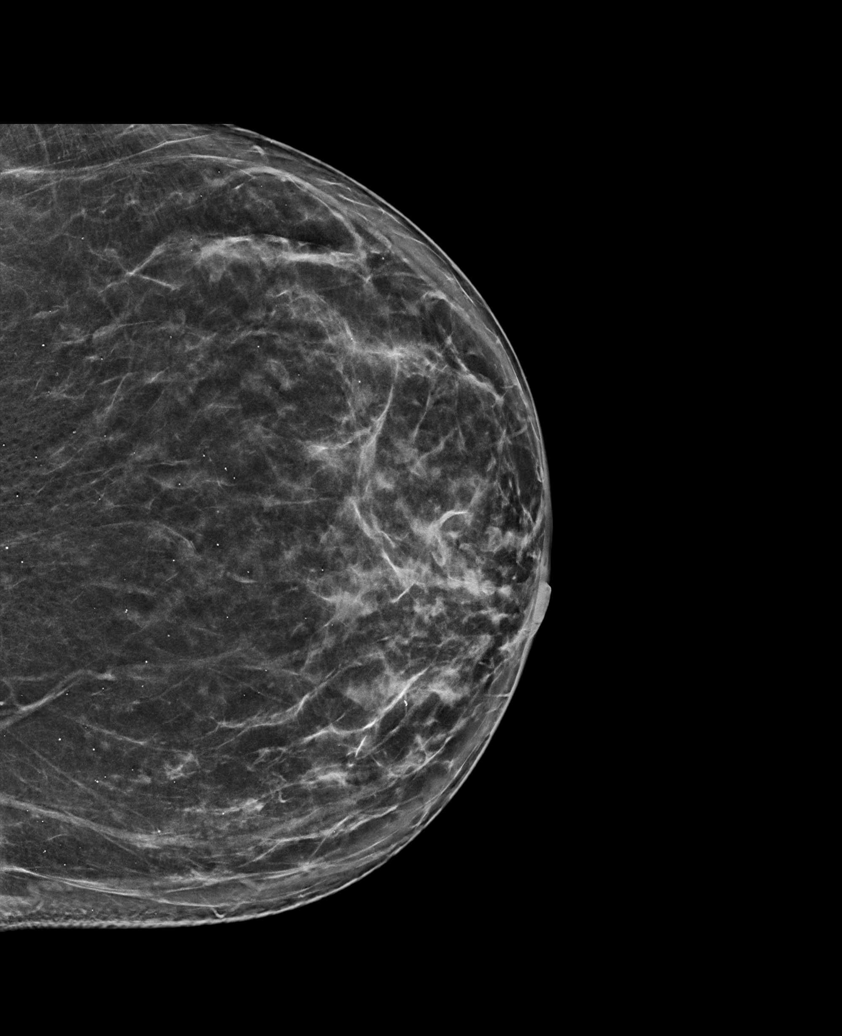

[R CC synth-2D]
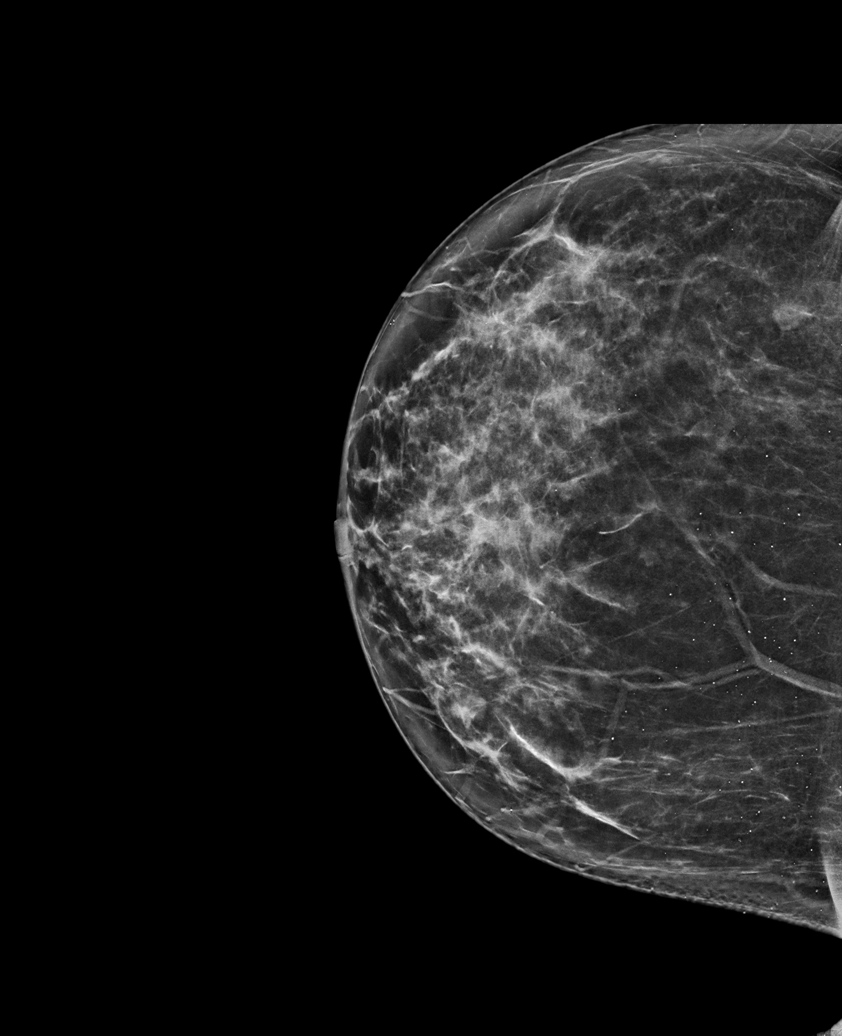

[R MLO synth-2D]
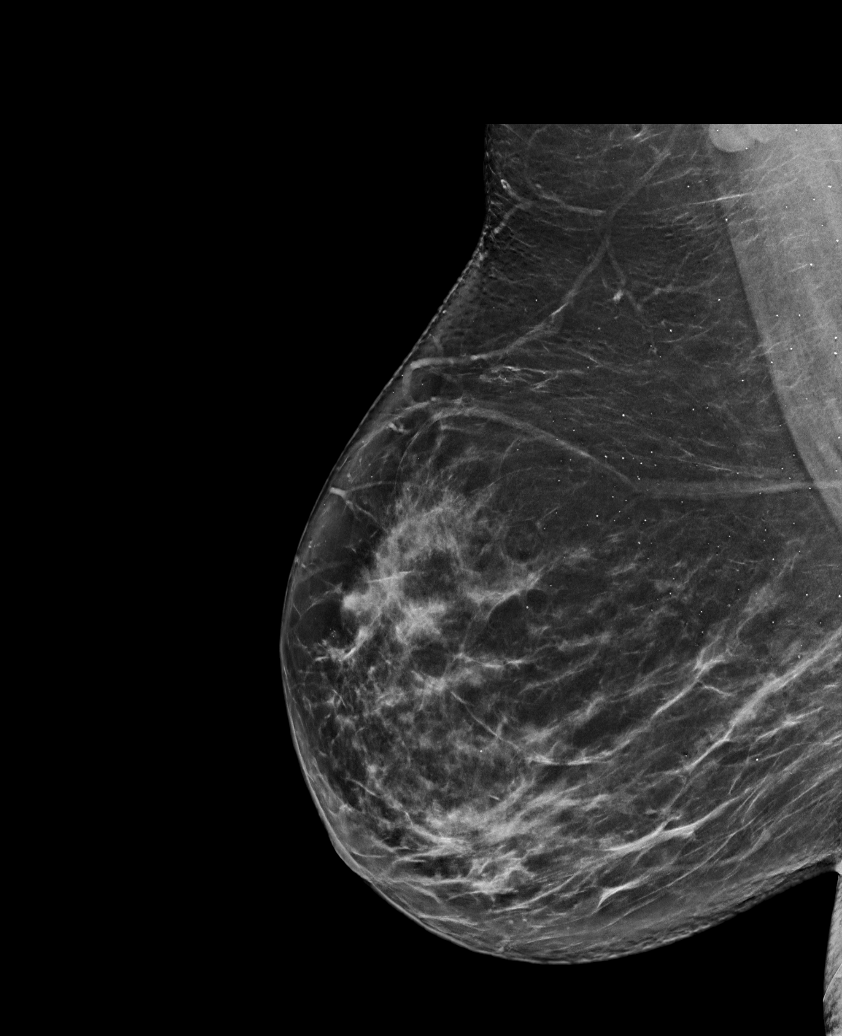

[L MLO synth-2D (2 of 2)]
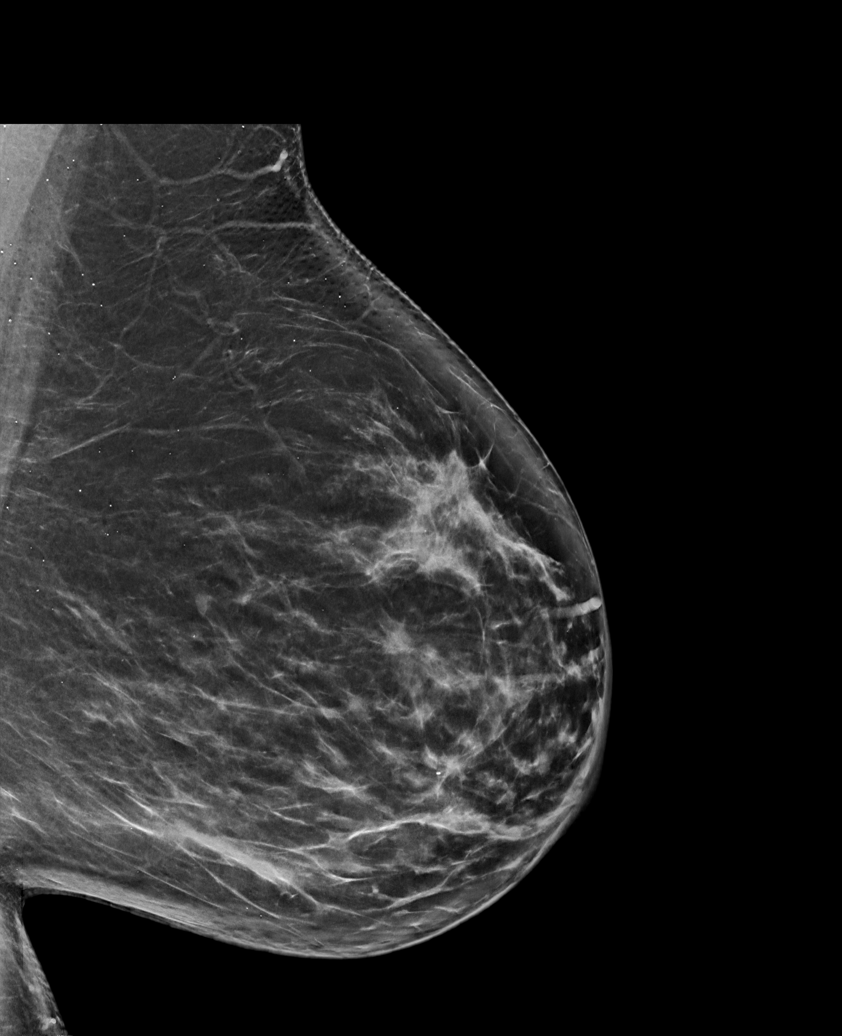

[R MLO tomo · tomo slice 49/98.0]
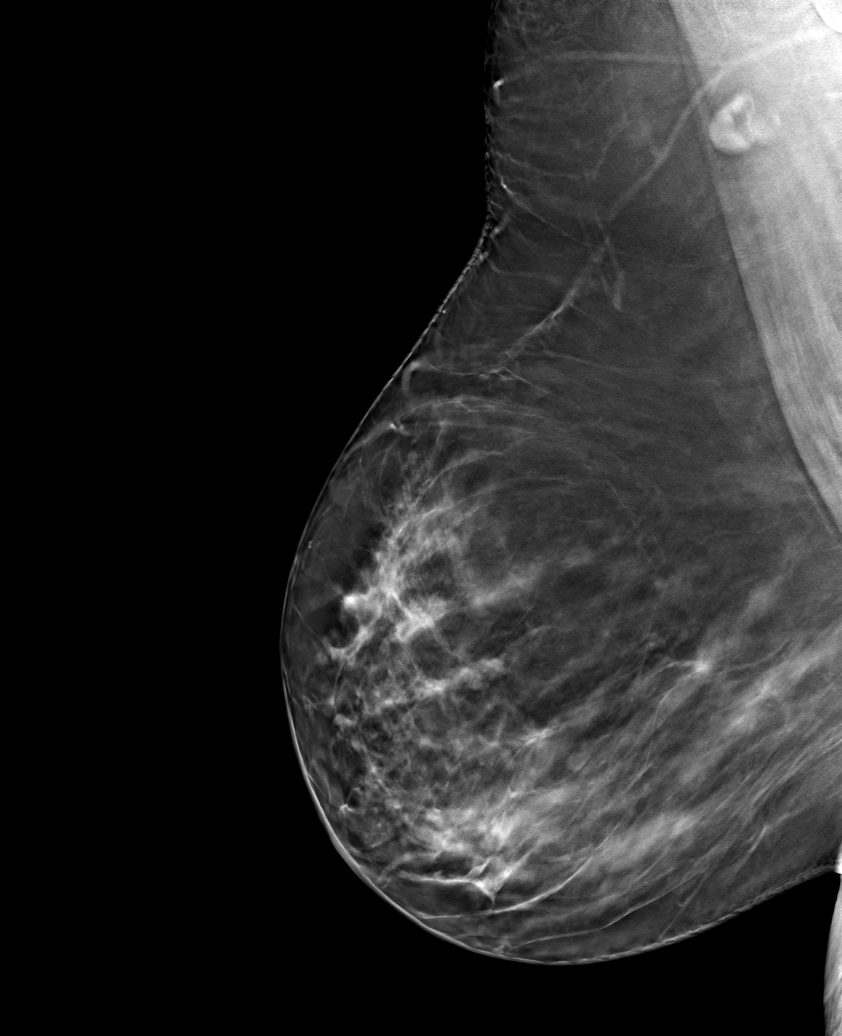

[6 of 30 positions shown; findings below may reference images not displayed]

ACR Breast Density Category b: There are scattered areas of
fibroglandular density.
FINDINGS: There are no findings suspicious for malignancy. The images were
evaluated with computer-aided detection.
IMPRESSION: No mammographic evidence of malignancy. A result letter of this
screening mammogram will be mailed directly to the patient.

RECOMMENDATION:
Screening mammogram in one year. (Code:ZM-T-JMV)

BI-RADS CATEGORY  1: Negative.

## 2021-04-01 ENCOUNTER — Other Ambulatory Visit (HOSPITAL_COMMUNITY): Payer: Self-pay

## 2021-04-01 MED ORDER — DICLOFENAC SODIUM 1 % EX GEL
CUTANEOUS | 2 refills | Status: DC
  Filled 2021-04-01: qty 100, 15d supply, fill #0
  Filled 2021-05-13: qty 100, 15d supply, fill #1
  Filled 2022-02-17 – 2022-02-19 (×2): qty 100, 15d supply, fill #2

## 2021-04-01 MED FILL — Syringe/Needle (Disp) 3 ML 25 x 1-1/2": 28 days supply | Qty: 4 | Fill #0 | Status: CN

## 2021-04-01 MED FILL — Cyanocobalamin Inj 1000 MCG/ML: INTRAMUSCULAR | 28 days supply | Qty: 4 | Fill #0 | Status: AC

## 2021-04-02 ENCOUNTER — Other Ambulatory Visit (HOSPITAL_COMMUNITY): Payer: Self-pay

## 2021-04-14 ENCOUNTER — Other Ambulatory Visit (HOSPITAL_COMMUNITY): Payer: Self-pay

## 2021-05-02 ENCOUNTER — Other Ambulatory Visit (HOSPITAL_COMMUNITY): Payer: Self-pay

## 2021-05-02 MED ORDER — EZETIMIBE 10 MG PO TABS
10.0000 mg | ORAL_TABLET | Freq: Every day | ORAL | 0 refills | Status: DC
  Filled 2021-05-02: qty 30, 30d supply, fill #0

## 2021-05-02 MED ORDER — DICYCLOMINE HCL 20 MG PO TABS
20.0000 mg | ORAL_TABLET | Freq: Three times a day (TID) | ORAL | 0 refills | Status: DC
  Filled 2021-05-02: qty 90, 30d supply, fill #0

## 2021-05-02 MED ORDER — HYDROXYZINE PAMOATE 25 MG PO CAPS
25.0000 mg | ORAL_CAPSULE | Freq: Two times a day (BID) | ORAL | 2 refills | Status: DC
  Filled 2021-05-02: qty 60, 30d supply, fill #0
  Filled 2021-06-26: qty 60, 30d supply, fill #1
  Filled 2021-08-11: qty 60, 30d supply, fill #2

## 2021-05-06 ENCOUNTER — Other Ambulatory Visit (HOSPITAL_COMMUNITY): Payer: Self-pay

## 2021-05-06 MED ORDER — VITAMIN D (ERGOCALCIFEROL) 1.25 MG (50000 UNIT) PO CAPS
50000.0000 [IU] | ORAL_CAPSULE | ORAL | 3 refills | Status: DC
  Filled 2021-05-06: qty 4, 28d supply, fill #0
  Filled 2021-07-22: qty 4, 28d supply, fill #1
  Filled 2021-09-04: qty 4, 28d supply, fill #2
  Filled 2021-09-29: qty 4, 28d supply, fill #3

## 2021-05-13 ENCOUNTER — Other Ambulatory Visit (HOSPITAL_COMMUNITY): Payer: Self-pay

## 2021-05-13 MED ORDER — GABAPENTIN 600 MG PO TABS
600.0000 mg | ORAL_TABLET | Freq: Every morning | ORAL | 2 refills | Status: AC
  Filled 2021-05-13: qty 30, 30d supply, fill #0
  Filled 2021-08-11: qty 30, 30d supply, fill #1

## 2021-05-20 ENCOUNTER — Other Ambulatory Visit (HOSPITAL_COMMUNITY): Payer: Self-pay

## 2021-05-20 DIAGNOSIS — E119 Type 2 diabetes mellitus without complications: Secondary | ICD-10-CM | POA: Diagnosis not present

## 2021-05-20 DIAGNOSIS — I10 Essential (primary) hypertension: Secondary | ICD-10-CM | POA: Diagnosis not present

## 2021-05-20 DIAGNOSIS — E782 Mixed hyperlipidemia: Secondary | ICD-10-CM | POA: Diagnosis not present

## 2021-05-20 DIAGNOSIS — J302 Other seasonal allergic rhinitis: Secondary | ICD-10-CM | POA: Diagnosis not present

## 2021-05-20 DIAGNOSIS — D518 Other vitamin B12 deficiency anemias: Secondary | ICD-10-CM | POA: Diagnosis not present

## 2021-05-20 DIAGNOSIS — E559 Vitamin D deficiency, unspecified: Secondary | ICD-10-CM | POA: Diagnosis not present

## 2021-05-20 DIAGNOSIS — E038 Other specified hypothyroidism: Secondary | ICD-10-CM | POA: Diagnosis not present

## 2021-05-20 MED ORDER — FLUTICASONE PROPIONATE 50 MCG/ACT NA SUSP
NASAL | 2 refills | Status: DC
  Filled 2021-05-20: qty 32, 60d supply, fill #0
  Filled 2021-08-11: qty 32, 60d supply, fill #1
  Filled 2021-12-15: qty 32, 60d supply, fill #2

## 2021-05-21 DIAGNOSIS — Z23 Encounter for immunization: Secondary | ICD-10-CM | POA: Diagnosis not present

## 2021-05-22 DIAGNOSIS — E038 Other specified hypothyroidism: Secondary | ICD-10-CM | POA: Diagnosis not present

## 2021-05-22 DIAGNOSIS — D72829 Elevated white blood cell count, unspecified: Secondary | ICD-10-CM | POA: Diagnosis not present

## 2021-05-22 DIAGNOSIS — E782 Mixed hyperlipidemia: Secondary | ICD-10-CM | POA: Diagnosis not present

## 2021-06-05 ENCOUNTER — Other Ambulatory Visit (HOSPITAL_COMMUNITY): Payer: Self-pay

## 2021-06-05 MED ORDER — LEVOTHYROXINE SODIUM 200 MCG PO TABS
ORAL_TABLET | ORAL | 3 refills | Status: DC
  Filled 2021-06-05: qty 30, 30d supply, fill #0
  Filled 2021-07-15: qty 30, 30d supply, fill #1

## 2021-06-26 ENCOUNTER — Other Ambulatory Visit (HOSPITAL_COMMUNITY): Payer: Self-pay

## 2021-06-26 MED ORDER — EZETIMIBE 10 MG PO TABS
10.0000 mg | ORAL_TABLET | Freq: Every day | ORAL | 0 refills | Status: DC
  Filled 2021-06-26: qty 30, 30d supply, fill #0

## 2021-06-26 MED ORDER — OMEPRAZOLE 40 MG PO CPDR: DELAYED_RELEASE_CAPSULE | ORAL | 3 refills | Status: DC

## 2021-06-26 MED ORDER — OMEPRAZOLE 40 MG PO CPDR
DELAYED_RELEASE_CAPSULE | ORAL | 2 refills | Status: DC
  Filled 2021-06-26: qty 30, 30d supply, fill #0
  Filled 2021-06-30: qty 30, 30d supply, fill #1

## 2021-06-26 MED ORDER — EZETIMIBE 10 MG PO TABS
10.0000 mg | ORAL_TABLET | Freq: Every day | ORAL | 1 refills | Status: DC
  Filled 2021-06-26: qty 30, 30d supply, fill #0

## 2021-06-27 DIAGNOSIS — Z1231 Encounter for screening mammogram for malignant neoplasm of breast: Secondary | ICD-10-CM | POA: Diagnosis not present

## 2021-06-30 ENCOUNTER — Other Ambulatory Visit (HOSPITAL_COMMUNITY): Payer: Self-pay

## 2021-06-30 MED ORDER — OMEPRAZOLE 40 MG PO CPDR
DELAYED_RELEASE_CAPSULE | ORAL | 3 refills | Status: DC
  Filled 2021-06-30 – 2021-07-22 (×2): qty 30, 30d supply, fill #0
  Filled 2021-08-30: qty 30, 30d supply, fill #1
  Filled 2021-09-29: qty 30, 30d supply, fill #2
  Filled 2021-11-12: qty 30, 30d supply, fill #3

## 2021-07-15 ENCOUNTER — Other Ambulatory Visit (HOSPITAL_COMMUNITY): Payer: Self-pay

## 2021-07-22 ENCOUNTER — Other Ambulatory Visit (HOSPITAL_COMMUNITY): Payer: Self-pay

## 2021-08-11 ENCOUNTER — Other Ambulatory Visit (HOSPITAL_COMMUNITY): Payer: Self-pay

## 2021-08-11 MED ORDER — EZETIMIBE 10 MG PO TABS
10.0000 mg | ORAL_TABLET | Freq: Every day | ORAL | 0 refills | Status: DC
  Filled 2021-08-11: qty 30, 30d supply, fill #0

## 2021-08-11 MED ORDER — DICYCLOMINE HCL 20 MG PO TABS
ORAL_TABLET | ORAL | 0 refills | Status: DC
  Filled 2021-08-11: qty 90, 30d supply, fill #0

## 2021-08-11 MED ORDER — SERTRALINE HCL 100 MG PO TABS
200.0000 mg | ORAL_TABLET | Freq: Every day | ORAL | 1 refills | Status: DC
  Filled 2021-08-11: qty 180, 90d supply, fill #0
  Filled 2022-03-24: qty 180, 90d supply, fill #1

## 2021-08-11 MED ORDER — BACLOFEN 20 MG PO TABS
ORAL_TABLET | ORAL | 0 refills | Status: DC
  Filled 2021-08-11: qty 270, 90d supply, fill #0

## 2021-08-17 DIAGNOSIS — F419 Anxiety disorder, unspecified: Secondary | ICD-10-CM | POA: Diagnosis not present

## 2021-08-18 ENCOUNTER — Other Ambulatory Visit (HOSPITAL_COMMUNITY): Payer: Self-pay

## 2021-08-20 ENCOUNTER — Other Ambulatory Visit: Payer: Self-pay | Admitting: Nurse Practitioner

## 2021-08-20 ENCOUNTER — Other Ambulatory Visit (HOSPITAL_COMMUNITY): Payer: Self-pay

## 2021-08-20 DIAGNOSIS — Z1231 Encounter for screening mammogram for malignant neoplasm of breast: Secondary | ICD-10-CM

## 2021-08-20 DIAGNOSIS — G43709 Chronic migraine without aura, not intractable, without status migrainosus: Secondary | ICD-10-CM | POA: Diagnosis not present

## 2021-08-20 DIAGNOSIS — N925 Other specified irregular menstruation: Secondary | ICD-10-CM | POA: Diagnosis not present

## 2021-08-20 DIAGNOSIS — E038 Other specified hypothyroidism: Secondary | ICD-10-CM | POA: Diagnosis not present

## 2021-08-20 DIAGNOSIS — R110 Nausea: Secondary | ICD-10-CM | POA: Diagnosis not present

## 2021-08-20 MED ORDER — WEGOVY 0.5 MG/0.5ML ~~LOC~~ SOAJ
SUBCUTANEOUS | 0 refills | Status: DC
Start: 2021-08-20 — End: 2023-03-25
  Filled 2021-08-20 – 2021-09-23 (×2): qty 2, 28d supply, fill #0

## 2021-08-20 MED ORDER — ONDANSETRON HCL 4 MG PO TABS
ORAL_TABLET | ORAL | 1 refills | Status: DC
  Filled 2021-08-20: qty 30, 10d supply, fill #0
  Filled 2022-03-24: qty 30, 10d supply, fill #1

## 2021-08-20 MED ORDER — AMITRIPTYLINE HCL 25 MG PO TABS
ORAL_TABLET | ORAL | 2 refills | Status: DC
  Filled 2021-08-20: qty 30, 30d supply, fill #0
  Filled 2021-09-29: qty 30, 30d supply, fill #1
  Filled 2021-11-12: qty 30, 30d supply, fill #2

## 2021-08-22 ENCOUNTER — Other Ambulatory Visit (HOSPITAL_COMMUNITY): Payer: Self-pay

## 2021-08-26 ENCOUNTER — Other Ambulatory Visit (HOSPITAL_COMMUNITY): Payer: Self-pay

## 2021-08-26 MED ORDER — LEVOTHYROXINE SODIUM 175 MCG PO TABS
ORAL_TABLET | ORAL | 4 refills | Status: DC
  Filled 2021-08-26: qty 30, 30d supply, fill #0
  Filled 2021-09-29: qty 30, 30d supply, fill #1
  Filled 2021-11-12: qty 30, 30d supply, fill #2
  Filled 2021-12-15: qty 30, 30d supply, fill #3
  Filled 2022-01-19: qty 30, 30d supply, fill #4

## 2021-08-26 MED ORDER — OZEMPIC (0.25 OR 0.5 MG/DOSE) 2 MG/1.5ML ~~LOC~~ SOPN
PEN_INJECTOR | SUBCUTANEOUS | 2 refills | Status: DC
  Filled 2021-08-26: qty 1.5, 28d supply, fill #0

## 2021-08-30 ENCOUNTER — Other Ambulatory Visit (HOSPITAL_COMMUNITY): Payer: Self-pay

## 2021-09-02 ENCOUNTER — Other Ambulatory Visit (HOSPITAL_COMMUNITY): Payer: Self-pay

## 2021-09-04 ENCOUNTER — Other Ambulatory Visit (HOSPITAL_COMMUNITY): Payer: Self-pay

## 2021-09-05 ENCOUNTER — Ambulatory Visit
Admission: RE | Admit: 2021-09-05 | Discharge: 2021-09-05 | Disposition: A | Discharge status: Home / Self Care | Payer: 59 | Source: Ambulatory Visit

## 2021-09-05 ENCOUNTER — Other Ambulatory Visit (HOSPITAL_COMMUNITY): Payer: Self-pay

## 2021-09-05 DIAGNOSIS — Z1231 Encounter for screening mammogram for malignant neoplasm of breast: Secondary | ICD-10-CM | POA: Diagnosis not present

## 2021-09-06 DIAGNOSIS — E038 Other specified hypothyroidism: Secondary | ICD-10-CM | POA: Diagnosis not present

## 2021-09-22 DIAGNOSIS — M542 Cervicalgia: Secondary | ICD-10-CM | POA: Diagnosis not present

## 2021-09-22 DIAGNOSIS — M62838 Other muscle spasm: Secondary | ICD-10-CM | POA: Diagnosis not present

## 2021-09-22 DIAGNOSIS — R51 Headache: Secondary | ICD-10-CM | POA: Diagnosis not present

## 2021-09-23 ENCOUNTER — Other Ambulatory Visit (HOSPITAL_COMMUNITY): Payer: Self-pay

## 2021-09-29 ENCOUNTER — Other Ambulatory Visit (HOSPITAL_COMMUNITY): Payer: Self-pay

## 2021-09-29 MED ORDER — HYDROXYZINE PAMOATE 25 MG PO CAPS
25.0000 mg | ORAL_CAPSULE | Freq: Two times a day (BID) | ORAL | 2 refills | Status: DC
  Filled 2021-09-29: qty 60, 30d supply, fill #0
  Filled 2021-12-15: qty 60, 30d supply, fill #1
  Filled 2022-02-13: qty 60, 30d supply, fill #2

## 2021-09-29 MED ORDER — GABAPENTIN 400 MG PO CAPS
ORAL_CAPSULE | ORAL | 2 refills | Status: DC
  Filled 2021-09-29: qty 60, 30d supply, fill #0
  Filled 2021-12-15: qty 60, 30d supply, fill #1
  Filled 2022-02-13: qty 60, 30d supply, fill #2

## 2021-09-29 MED ORDER — EZETIMIBE 10 MG PO TABS
10.0000 mg | ORAL_TABLET | Freq: Every day | ORAL | 0 refills | Status: DC
  Filled 2021-09-29: qty 30, 30d supply, fill #0

## 2021-09-29 MED ORDER — DICYCLOMINE HCL 20 MG PO TABS
ORAL_TABLET | ORAL | 0 refills | Status: DC
  Filled 2021-09-29: qty 90, 30d supply, fill #0

## 2021-09-30 ENCOUNTER — Other Ambulatory Visit (HOSPITAL_COMMUNITY): Payer: Self-pay

## 2021-09-30 MED ORDER — GABAPENTIN 600 MG PO TABS
ORAL_TABLET | ORAL | 2 refills | Status: DC
  Filled 2021-09-30: qty 30, 30d supply, fill #0
  Filled 2022-02-13: qty 30, 30d supply, fill #1
  Filled 2022-04-28: qty 30, 30d supply, fill #2

## 2021-10-02 ENCOUNTER — Other Ambulatory Visit (HOSPITAL_COMMUNITY): Payer: Self-pay

## 2021-10-04 DIAGNOSIS — G9009 Other idiopathic peripheral autonomic neuropathy: Secondary | ICD-10-CM | POA: Diagnosis not present

## 2021-11-12 ENCOUNTER — Other Ambulatory Visit (HOSPITAL_COMMUNITY): Payer: Self-pay

## 2021-12-15 ENCOUNTER — Other Ambulatory Visit (HOSPITAL_COMMUNITY): Payer: Self-pay

## 2021-12-15 MED ORDER — BACLOFEN 20 MG PO TABS
ORAL_TABLET | ORAL | 0 refills | Status: DC
  Filled 2021-12-15: qty 270, 90d supply, fill #0

## 2021-12-15 MED ORDER — DICYCLOMINE HCL 20 MG PO TABS
ORAL_TABLET | ORAL | 0 refills | Status: DC
  Filled 2021-12-15: qty 90, 30d supply, fill #0

## 2021-12-15 MED ORDER — OMEPRAZOLE 40 MG PO CPDR
DELAYED_RELEASE_CAPSULE | ORAL | 3 refills | Status: DC
  Filled 2021-12-15: qty 30, 30d supply, fill #0
  Filled 2022-01-19: qty 30, 30d supply, fill #1
  Filled 2022-02-17: qty 30, 30d supply, fill #2
  Filled 2022-03-24: qty 30, 30d supply, fill #3

## 2021-12-16 ENCOUNTER — Other Ambulatory Visit (HOSPITAL_COMMUNITY): Payer: Self-pay

## 2021-12-23 DIAGNOSIS — J302 Other seasonal allergic rhinitis: Secondary | ICD-10-CM | POA: Diagnosis not present

## 2021-12-23 DIAGNOSIS — M79641 Pain in right hand: Secondary | ICD-10-CM | POA: Diagnosis not present

## 2021-12-23 DIAGNOSIS — F419 Anxiety disorder, unspecified: Secondary | ICD-10-CM | POA: Diagnosis not present

## 2021-12-23 DIAGNOSIS — G43709 Chronic migraine without aura, not intractable, without status migrainosus: Secondary | ICD-10-CM | POA: Diagnosis not present

## 2021-12-26 ENCOUNTER — Other Ambulatory Visit (HOSPITAL_COMMUNITY): Payer: Self-pay

## 2021-12-26 MED ORDER — VITAMIN D (ERGOCALCIFEROL) 1.25 MG (50000 UNIT) PO CAPS
50000.0000 [IU] | ORAL_CAPSULE | ORAL | 3 refills | Status: DC
  Filled 2021-12-26: qty 4, 28d supply, fill #0
  Filled 2022-02-13: qty 4, 28d supply, fill #1
  Filled 2022-03-24: qty 4, 28d supply, fill #2
  Filled 2022-04-28: qty 4, 28d supply, fill #3

## 2022-01-04 ENCOUNTER — Other Ambulatory Visit (HOSPITAL_COMMUNITY): Payer: Self-pay

## 2022-01-05 ENCOUNTER — Other Ambulatory Visit (HOSPITAL_COMMUNITY): Payer: Self-pay

## 2022-01-05 MED ORDER — AMITRIPTYLINE HCL 25 MG PO TABS
25.0000 mg | ORAL_TABLET | Freq: Every day | ORAL | 2 refills | Status: DC
  Filled 2022-01-05: qty 30, 30d supply, fill #0

## 2022-01-05 MED ORDER — CYANOCOBALAMIN 1000 MCG/ML IJ SOLN
INTRAMUSCULAR | 6 refills | Status: DC
  Filled 2022-01-05: qty 4, 28d supply, fill #0

## 2022-01-06 ENCOUNTER — Other Ambulatory Visit (HOSPITAL_COMMUNITY): Payer: Self-pay

## 2022-01-06 MED ORDER — "SYRINGE/NEEDLE (DISP) 25G X 5/8"" 3 ML MISC"
6 refills | Status: DC
  Filled 2022-01-06: qty 4, 28d supply, fill #0

## 2022-01-14 DIAGNOSIS — E038 Other specified hypothyroidism: Secondary | ICD-10-CM | POA: Diagnosis not present

## 2022-01-14 DIAGNOSIS — E559 Vitamin D deficiency, unspecified: Secondary | ICD-10-CM | POA: Diagnosis not present

## 2022-01-14 DIAGNOSIS — D518 Other vitamin B12 deficiency anemias: Secondary | ICD-10-CM | POA: Diagnosis not present

## 2022-01-14 DIAGNOSIS — I10 Essential (primary) hypertension: Secondary | ICD-10-CM | POA: Diagnosis not present

## 2022-01-14 DIAGNOSIS — Z0000 Encounter for general adult medical examination without abnormal findings: Secondary | ICD-10-CM | POA: Diagnosis not present

## 2022-01-14 DIAGNOSIS — E782 Mixed hyperlipidemia: Secondary | ICD-10-CM | POA: Diagnosis not present

## 2022-01-14 DIAGNOSIS — E119 Type 2 diabetes mellitus without complications: Secondary | ICD-10-CM | POA: Diagnosis not present

## 2022-01-15 DIAGNOSIS — L601 Onycholysis: Secondary | ICD-10-CM | POA: Diagnosis not present

## 2022-01-15 DIAGNOSIS — M79671 Pain in right foot: Secondary | ICD-10-CM | POA: Diagnosis not present

## 2022-01-15 DIAGNOSIS — M79672 Pain in left foot: Secondary | ICD-10-CM | POA: Diagnosis not present

## 2022-01-15 DIAGNOSIS — B353 Tinea pedis: Secondary | ICD-10-CM | POA: Diagnosis not present

## 2022-01-19 ENCOUNTER — Other Ambulatory Visit (HOSPITAL_COMMUNITY): Payer: Self-pay

## 2022-01-20 ENCOUNTER — Other Ambulatory Visit (HOSPITAL_COMMUNITY): Payer: Self-pay

## 2022-01-20 DIAGNOSIS — D75839 Thrombocytosis, unspecified: Secondary | ICD-10-CM | POA: Diagnosis not present

## 2022-01-20 DIAGNOSIS — D72829 Elevated white blood cell count, unspecified: Secondary | ICD-10-CM | POA: Diagnosis not present

## 2022-01-20 MED ORDER — LEVOTHYROXINE SODIUM 175 MCG PO TABS
ORAL_TABLET | ORAL | 2 refills | Status: DC
  Filled 2022-01-20 – 2022-02-17 (×2): qty 30, 30d supply, fill #0

## 2022-01-20 MED ORDER — NORETHINDRONE ACET-ETHINYL EST 1.5-30 MG-MCG PO TABS
ORAL_TABLET | ORAL | 2 refills | Status: DC
  Filled 2022-01-20 – 2022-04-28 (×2): qty 63, 63d supply, fill #0

## 2022-02-13 ENCOUNTER — Other Ambulatory Visit (HOSPITAL_COMMUNITY): Payer: Self-pay

## 2022-02-17 ENCOUNTER — Other Ambulatory Visit (HOSPITAL_COMMUNITY): Payer: Self-pay

## 2022-02-17 MED ORDER — FLUTICASONE PROPIONATE 50 MCG/ACT NA SUSP
NASAL | 2 refills | Status: DC
  Filled 2022-02-17: qty 32, 60d supply, fill #0
  Filled 2022-05-24: qty 32, 60d supply, fill #1
  Filled 2022-09-03: qty 32, 60d supply, fill #2

## 2022-02-19 ENCOUNTER — Other Ambulatory Visit (HOSPITAL_COMMUNITY): Payer: Self-pay

## 2022-02-23 ENCOUNTER — Other Ambulatory Visit (HOSPITAL_COMMUNITY): Payer: Self-pay

## 2022-02-23 MED ORDER — LEVOTHYROXINE SODIUM 175 MCG PO TABS
ORAL_TABLET | ORAL | 2 refills | Status: DC
  Filled 2022-02-23 – 2022-03-24 (×2): qty 30, 30d supply, fill #0
  Filled 2022-05-24: qty 30, 30d supply, fill #1
  Filled 2022-06-30: qty 30, 30d supply, fill #2

## 2022-02-24 ENCOUNTER — Other Ambulatory Visit (HOSPITAL_COMMUNITY): Payer: Self-pay

## 2022-03-04 ENCOUNTER — Other Ambulatory Visit (HOSPITAL_COMMUNITY): Payer: Self-pay

## 2022-03-17 DIAGNOSIS — M9902 Segmental and somatic dysfunction of thoracic region: Secondary | ICD-10-CM | POA: Diagnosis not present

## 2022-03-17 DIAGNOSIS — M546 Pain in thoracic spine: Secondary | ICD-10-CM | POA: Diagnosis not present

## 2022-03-17 DIAGNOSIS — M9901 Segmental and somatic dysfunction of cervical region: Secondary | ICD-10-CM | POA: Diagnosis not present

## 2022-03-17 DIAGNOSIS — M542 Cervicalgia: Secondary | ICD-10-CM | POA: Diagnosis not present

## 2022-03-24 ENCOUNTER — Other Ambulatory Visit (HOSPITAL_COMMUNITY): Payer: Self-pay

## 2022-03-24 MED ORDER — HYDROXYZINE PAMOATE 25 MG PO CAPS
25.0000 mg | ORAL_CAPSULE | Freq: Two times a day (BID) | ORAL | 2 refills | Status: DC
Start: 2022-03-24 — End: 2022-09-03
  Filled 2022-03-24: qty 60, 30d supply, fill #0
  Filled 2022-04-28: qty 60, 30d supply, fill #1
  Filled 2022-07-10: qty 60, 30d supply, fill #2

## 2022-03-24 MED ORDER — EZETIMIBE 10 MG PO TABS
10.0000 mg | ORAL_TABLET | Freq: Every day | ORAL | 0 refills | Status: DC
  Filled 2022-03-24: qty 30, 30d supply, fill #0

## 2022-04-09 ENCOUNTER — Other Ambulatory Visit (HOSPITAL_COMMUNITY): Payer: Self-pay

## 2022-04-09 MED ORDER — OMRON 3 SERIES BP MONITOR DEVI
0 refills | Status: AC
  Filled 2022-04-09: qty 1, 30d supply, fill #0

## 2022-04-16 DIAGNOSIS — F419 Anxiety disorder, unspecified: Secondary | ICD-10-CM | POA: Diagnosis not present

## 2022-04-16 DIAGNOSIS — E782 Mixed hyperlipidemia: Secondary | ICD-10-CM | POA: Diagnosis not present

## 2022-04-16 DIAGNOSIS — E038 Other specified hypothyroidism: Secondary | ICD-10-CM | POA: Diagnosis not present

## 2022-04-16 DIAGNOSIS — G43709 Chronic migraine without aura, not intractable, without status migrainosus: Secondary | ICD-10-CM | POA: Diagnosis not present

## 2022-04-28 ENCOUNTER — Other Ambulatory Visit (HOSPITAL_COMMUNITY): Payer: Self-pay

## 2022-04-28 MED ORDER — OMEPRAZOLE 40 MG PO CPDR
40.0000 mg | DELAYED_RELEASE_CAPSULE | Freq: Every morning | ORAL | 3 refills | Status: DC
  Filled 2022-04-28: qty 30, 30d supply, fill #0
  Filled 2022-05-24: qty 30, 30d supply, fill #1
  Filled 2022-06-30: qty 30, 30d supply, fill #2
  Filled 2022-07-30: qty 30, 30d supply, fill #3

## 2022-04-28 MED ORDER — BACLOFEN 20 MG PO TABS
20.0000 mg | ORAL_TABLET | Freq: Three times a day (TID) | ORAL | 0 refills | Status: DC
  Filled 2022-04-28: qty 270, 90d supply, fill #0

## 2022-04-28 MED ORDER — GABAPENTIN 400 MG PO CAPS
400.0000 mg | ORAL_CAPSULE | Freq: Two times a day (BID) | ORAL | 2 refills | Status: DC
  Filled 2022-04-28: qty 60, 30d supply, fill #0
  Filled 2022-06-08: qty 60, 30d supply, fill #1
  Filled 2022-07-30: qty 60, 30d supply, fill #2

## 2022-04-29 ENCOUNTER — Other Ambulatory Visit (HOSPITAL_COMMUNITY): Payer: Self-pay

## 2022-05-12 DIAGNOSIS — Z23 Encounter for immunization: Secondary | ICD-10-CM | POA: Diagnosis not present

## 2022-05-12 DIAGNOSIS — F410 Panic disorder [episodic paroxysmal anxiety] without agoraphobia: Secondary | ICD-10-CM | POA: Diagnosis not present

## 2022-05-12 DIAGNOSIS — R0683 Snoring: Secondary | ICD-10-CM | POA: Diagnosis not present

## 2022-05-12 DIAGNOSIS — F411 Generalized anxiety disorder: Secondary | ICD-10-CM | POA: Diagnosis not present

## 2022-05-13 ENCOUNTER — Other Ambulatory Visit (HOSPITAL_COMMUNITY): Payer: Self-pay

## 2022-05-13 MED ORDER — CYCLOBENZAPRINE HCL 10 MG PO TABS
10.0000 mg | ORAL_TABLET | Freq: Every day | ORAL | 2 refills | Status: DC | PRN
  Filled 2022-05-13: qty 30, 30d supply, fill #0
  Filled 2022-06-08: qty 30, 30d supply, fill #1
  Filled 2022-07-10: qty 30, 30d supply, fill #2

## 2022-05-14 ENCOUNTER — Other Ambulatory Visit (HOSPITAL_COMMUNITY): Payer: Self-pay

## 2022-05-18 ENCOUNTER — Other Ambulatory Visit (HOSPITAL_COMMUNITY): Payer: Self-pay

## 2022-05-21 ENCOUNTER — Other Ambulatory Visit (HOSPITAL_COMMUNITY): Payer: Self-pay

## 2022-05-21 MED ORDER — OXYBUTYNIN CHLORIDE 5 MG PO TABS
5.0000 mg | ORAL_TABLET | Freq: Three times a day (TID) | ORAL | 4 refills | Status: DC
  Filled 2022-05-21: qty 90, 30d supply, fill #0

## 2022-05-22 ENCOUNTER — Other Ambulatory Visit (HOSPITAL_COMMUNITY): Payer: Self-pay

## 2022-05-25 ENCOUNTER — Other Ambulatory Visit (HOSPITAL_COMMUNITY): Payer: Self-pay

## 2022-05-26 ENCOUNTER — Telehealth: Payer: Self-pay | Admitting: *Deleted

## 2022-05-26 NOTE — Telephone Encounter (Signed)
Brenda Ruiz from [facility] [phone] Called to check in on status of referral. Patient has not called back to schedule from the referral.

## 2022-05-27 NOTE — Telephone Encounter (Signed)
Patient is scheduled to see AO on 11/15 at 130pm for sleep consult- nothing further needed.

## 2022-06-08 ENCOUNTER — Other Ambulatory Visit (HOSPITAL_COMMUNITY): Payer: Self-pay

## 2022-06-10 ENCOUNTER — Institutional Professional Consult (permissible substitution): Payer: 59 | Admitting: Pulmonary Disease

## 2022-06-23 DIAGNOSIS — J310 Chronic rhinitis: Secondary | ICD-10-CM | POA: Diagnosis not present

## 2022-06-23 DIAGNOSIS — H9193 Unspecified hearing loss, bilateral: Secondary | ICD-10-CM | POA: Diagnosis not present

## 2022-06-23 DIAGNOSIS — J3489 Other specified disorders of nose and nasal sinuses: Secondary | ICD-10-CM | POA: Diagnosis not present

## 2022-06-23 DIAGNOSIS — H9209 Otalgia, unspecified ear: Secondary | ICD-10-CM | POA: Diagnosis not present

## 2022-06-23 DIAGNOSIS — H6593 Unspecified nonsuppurative otitis media, bilateral: Secondary | ICD-10-CM | POA: Diagnosis not present

## 2022-06-23 DIAGNOSIS — H6993 Unspecified Eustachian tube disorder, bilateral: Secondary | ICD-10-CM | POA: Diagnosis not present

## 2022-06-23 DIAGNOSIS — J342 Deviated nasal septum: Secondary | ICD-10-CM | POA: Diagnosis not present

## 2022-06-30 ENCOUNTER — Other Ambulatory Visit (HOSPITAL_COMMUNITY): Payer: Self-pay

## 2022-06-30 MED ORDER — NORETHINDRONE ACET-ETHINYL EST 1.5-30 MG-MCG PO TABS
1.0000 | ORAL_TABLET | Freq: Every day | ORAL | 2 refills | Status: DC
  Filled 2022-06-30: qty 63, 63d supply, fill #0

## 2022-07-07 DIAGNOSIS — H9193 Unspecified hearing loss, bilateral: Secondary | ICD-10-CM | POA: Diagnosis not present

## 2022-07-07 DIAGNOSIS — T85618S Breakdown (mechanical) of other specified internal prosthetic devices, implants and grafts, sequela: Secondary | ICD-10-CM | POA: Diagnosis not present

## 2022-07-07 DIAGNOSIS — H6993 Unspecified Eustachian tube disorder, bilateral: Secondary | ICD-10-CM | POA: Diagnosis not present

## 2022-07-10 ENCOUNTER — Other Ambulatory Visit (HOSPITAL_COMMUNITY): Payer: Self-pay

## 2022-07-10 ENCOUNTER — Other Ambulatory Visit: Payer: Self-pay

## 2022-07-10 MED ORDER — GABAPENTIN 600 MG PO TABS
600.0000 mg | ORAL_TABLET | Freq: Every morning | ORAL | 2 refills | Status: DC
  Filled 2022-07-10: qty 30, 30d supply, fill #0
  Filled 2022-09-23: qty 30, 30d supply, fill #1
  Filled 2022-11-16: qty 30, 30d supply, fill #2

## 2022-07-10 MED ORDER — VITAMIN D (ERGOCALCIFEROL) 1.25 MG (50000 UNIT) PO CAPS
50000.0000 [IU] | ORAL_CAPSULE | ORAL | 2 refills | Status: DC
  Filled 2022-07-10: qty 4, 28d supply, fill #0
  Filled 2022-08-09: qty 4, 28d supply, fill #1
  Filled 2022-10-13: qty 4, 28d supply, fill #2

## 2022-07-10 MED ORDER — ONDANSETRON HCL 4 MG PO TABS
4.0000 mg | ORAL_TABLET | Freq: Three times a day (TID) | ORAL | 1 refills | Status: DC | PRN
  Filled 2022-07-10: qty 30, 10d supply, fill #0
  Filled 2023-02-12: qty 30, 10d supply, fill #1

## 2022-07-14 ENCOUNTER — Other Ambulatory Visit (HOSPITAL_COMMUNITY): Payer: Self-pay

## 2022-07-14 DIAGNOSIS — N3289 Other specified disorders of bladder: Secondary | ICD-10-CM | POA: Diagnosis not present

## 2022-07-14 DIAGNOSIS — Z905 Acquired absence of kidney: Secondary | ICD-10-CM | POA: Diagnosis not present

## 2022-07-14 DIAGNOSIS — R3915 Urgency of urination: Secondary | ICD-10-CM | POA: Insufficient documentation

## 2022-07-14 DIAGNOSIS — M170 Bilateral primary osteoarthritis of knee: Secondary | ICD-10-CM | POA: Insufficient documentation

## 2022-07-14 DIAGNOSIS — N3941 Urge incontinence: Secondary | ICD-10-CM | POA: Insufficient documentation

## 2022-07-14 DIAGNOSIS — N3010 Interstitial cystitis (chronic) without hematuria: Secondary | ICD-10-CM | POA: Diagnosis not present

## 2022-07-14 DIAGNOSIS — R350 Frequency of micturition: Secondary | ICD-10-CM | POA: Insufficient documentation

## 2022-07-14 MED ORDER — OXYBUTYNIN CHLORIDE ER 10 MG PO TB24
10.0000 mg | ORAL_TABLET | Freq: Every day | ORAL | 5 refills | Status: DC
  Filled 2022-07-14: qty 30, 30d supply, fill #0
  Filled 2022-12-23: qty 30, 30d supply, fill #1

## 2022-07-14 MED ORDER — URIBEL 118 MG PO CAPS
118.0000 mg | ORAL_CAPSULE | Freq: Four times a day (QID) | ORAL | 99 refills | Status: AC
  Filled 2022-07-14: qty 120, 30d supply, fill #0

## 2022-07-30 ENCOUNTER — Other Ambulatory Visit (HOSPITAL_COMMUNITY): Payer: Self-pay

## 2022-07-30 DIAGNOSIS — Z130 Encounter for screening for diseases of the blood and blood-forming organs and certain disorders involving the immune mechanism: Secondary | ICD-10-CM | POA: Diagnosis not present

## 2022-07-30 DIAGNOSIS — Z79899 Other long term (current) drug therapy: Secondary | ICD-10-CM | POA: Diagnosis not present

## 2022-07-30 DIAGNOSIS — D518 Other vitamin B12 deficiency anemias: Secondary | ICD-10-CM | POA: Diagnosis not present

## 2022-07-30 DIAGNOSIS — G43709 Chronic migraine without aura, not intractable, without status migrainosus: Secondary | ICD-10-CM | POA: Diagnosis not present

## 2022-07-30 DIAGNOSIS — E119 Type 2 diabetes mellitus without complications: Secondary | ICD-10-CM | POA: Diagnosis not present

## 2022-07-30 DIAGNOSIS — E039 Hypothyroidism, unspecified: Secondary | ICD-10-CM | POA: Diagnosis not present

## 2022-07-30 DIAGNOSIS — I10 Essential (primary) hypertension: Secondary | ICD-10-CM | POA: Diagnosis not present

## 2022-07-30 DIAGNOSIS — E559 Vitamin D deficiency, unspecified: Secondary | ICD-10-CM | POA: Diagnosis not present

## 2022-07-30 DIAGNOSIS — E785 Hyperlipidemia, unspecified: Secondary | ICD-10-CM | POA: Diagnosis not present

## 2022-07-30 MED ORDER — MONTELUKAST SODIUM 10 MG PO TABS
10.0000 mg | ORAL_TABLET | Freq: Every day | ORAL | 2 refills | Status: DC
  Filled 2022-07-30: qty 30, 30d supply, fill #0
  Filled 2022-09-03: qty 30, 30d supply, fill #1
  Filled 2022-10-13: qty 30, 30d supply, fill #2

## 2022-08-03 ENCOUNTER — Other Ambulatory Visit: Payer: Self-pay

## 2022-08-03 ENCOUNTER — Other Ambulatory Visit (HOSPITAL_COMMUNITY): Payer: Self-pay

## 2022-08-03 MED ORDER — LEVOTHYROXINE SODIUM 200 MCG PO TABS
200.0000 ug | ORAL_TABLET | Freq: Every morning | ORAL | 2 refills | Status: DC
  Filled 2022-08-03: qty 30, 30d supply, fill #0
  Filled 2022-09-03: qty 30, 30d supply, fill #1
  Filled 2022-10-13: qty 30, 30d supply, fill #2

## 2022-08-04 ENCOUNTER — Other Ambulatory Visit (HOSPITAL_COMMUNITY): Payer: Self-pay

## 2022-08-09 ENCOUNTER — Other Ambulatory Visit (HOSPITAL_COMMUNITY): Payer: Self-pay

## 2022-08-10 ENCOUNTER — Other Ambulatory Visit: Payer: Self-pay

## 2022-08-10 ENCOUNTER — Other Ambulatory Visit (HOSPITAL_COMMUNITY): Payer: Self-pay

## 2022-08-10 MED ORDER — DICYCLOMINE HCL 20 MG PO TABS
20.0000 mg | ORAL_TABLET | Freq: Three times a day (TID) | ORAL | 5 refills | Status: AC
  Filled 2022-08-10: qty 90, 30d supply, fill #0
  Filled 2022-09-03: qty 90, 30d supply, fill #1
  Filled 2022-11-16: qty 90, 30d supply, fill #2
  Filled 2023-05-18: qty 90, 30d supply, fill #3
  Filled 2023-07-13: qty 90, 30d supply, fill #4

## 2022-08-10 MED ORDER — EZETIMIBE 10 MG PO TABS
10.0000 mg | ORAL_TABLET | Freq: Every day | ORAL | 5 refills | Status: DC
  Filled 2022-08-10: qty 30, 30d supply, fill #0

## 2022-08-10 MED ORDER — CYCLOBENZAPRINE HCL 10 MG PO TABS
10.0000 mg | ORAL_TABLET | Freq: Every evening | ORAL | 2 refills | Status: DC | PRN
  Filled 2022-08-10: qty 30, 30d supply, fill #0
  Filled 2022-09-23: qty 30, 30d supply, fill #1
  Filled 2023-01-29: qty 30, 30d supply, fill #2

## 2022-08-11 ENCOUNTER — Other Ambulatory Visit (HOSPITAL_COMMUNITY): Payer: Self-pay

## 2022-08-25 DIAGNOSIS — E785 Hyperlipidemia, unspecified: Secondary | ICD-10-CM | POA: Diagnosis not present

## 2022-08-25 DIAGNOSIS — J302 Other seasonal allergic rhinitis: Secondary | ICD-10-CM | POA: Diagnosis not present

## 2022-08-25 DIAGNOSIS — G43709 Chronic migraine without aura, not intractable, without status migrainosus: Secondary | ICD-10-CM | POA: Diagnosis not present

## 2022-08-25 DIAGNOSIS — Z79899 Other long term (current) drug therapy: Secondary | ICD-10-CM | POA: Diagnosis not present

## 2022-08-25 DIAGNOSIS — I10 Essential (primary) hypertension: Secondary | ICD-10-CM | POA: Diagnosis not present

## 2022-09-03 ENCOUNTER — Other Ambulatory Visit: Payer: Self-pay

## 2022-09-03 ENCOUNTER — Other Ambulatory Visit (HOSPITAL_COMMUNITY): Payer: Self-pay

## 2022-09-03 MED ORDER — GABAPENTIN 400 MG PO CAPS
400.0000 mg | ORAL_CAPSULE | Freq: Two times a day (BID) | ORAL | 2 refills | Status: DC
  Filled 2022-09-03: qty 60, 30d supply, fill #0
  Filled 2022-10-13: qty 60, 30d supply, fill #1
  Filled 2022-11-16: qty 60, 30d supply, fill #2

## 2022-09-03 MED ORDER — HYDROXYZINE PAMOATE 25 MG PO CAPS
25.0000 mg | ORAL_CAPSULE | Freq: Two times a day (BID) | ORAL | 2 refills | Status: DC
  Filled 2022-09-03: qty 60, 30d supply, fill #0
  Filled 2022-10-13: qty 60, 30d supply, fill #1
  Filled 2022-11-16: qty 60, 30d supply, fill #2

## 2022-09-03 MED ORDER — NORETHINDRONE ACET-ETHINYL EST 1.5-30 MG-MCG PO TABS
1.0000 | ORAL_TABLET | Freq: Every day | ORAL | 11 refills | Status: DC
  Filled 2022-09-03: qty 84, 84d supply, fill #0
  Filled 2022-12-10: qty 21, 21d supply, fill #1
  Filled 2023-01-15: qty 84, 84d supply, fill #2

## 2022-09-03 MED ORDER — OMEPRAZOLE 40 MG PO CPDR
40.0000 mg | DELAYED_RELEASE_CAPSULE | Freq: Every morning | ORAL | 3 refills | Status: DC
  Filled 2022-09-03: qty 30, 30d supply, fill #0
  Filled 2022-10-13: qty 30, 30d supply, fill #1
  Filled 2022-11-16: qty 30, 30d supply, fill #2
  Filled 2022-12-23: qty 30, 30d supply, fill #3

## 2022-09-07 ENCOUNTER — Institutional Professional Consult (permissible substitution): Payer: Self-pay | Admitting: Pulmonary Disease

## 2022-09-10 DIAGNOSIS — H6993 Unspecified Eustachian tube disorder, bilateral: Secondary | ICD-10-CM | POA: Diagnosis not present

## 2022-09-10 DIAGNOSIS — T85618S Breakdown (mechanical) of other specified internal prosthetic devices, implants and grafts, sequela: Secondary | ICD-10-CM | POA: Diagnosis not present

## 2022-09-10 DIAGNOSIS — E039 Hypothyroidism, unspecified: Secondary | ICD-10-CM | POA: Diagnosis not present

## 2022-09-10 DIAGNOSIS — G500 Trigeminal neuralgia: Secondary | ICD-10-CM | POA: Diagnosis not present

## 2022-09-10 DIAGNOSIS — H9190 Unspecified hearing loss, unspecified ear: Secondary | ICD-10-CM | POA: Diagnosis not present

## 2022-09-23 ENCOUNTER — Other Ambulatory Visit: Payer: Self-pay

## 2022-09-23 ENCOUNTER — Other Ambulatory Visit (HOSPITAL_COMMUNITY): Payer: Self-pay

## 2022-09-23 MED ORDER — DICLOFENAC SODIUM 1 % EX GEL
CUTANEOUS | 2 refills | Status: DC
  Filled 2022-09-23: qty 100, 25d supply, fill #0
  Filled 2023-01-29: qty 100, 25d supply, fill #1
  Filled 2023-02-22 (×2): qty 100, 25d supply, fill #2

## 2022-09-24 ENCOUNTER — Other Ambulatory Visit (HOSPITAL_COMMUNITY): Payer: Self-pay

## 2022-09-24 ENCOUNTER — Other Ambulatory Visit: Payer: Self-pay

## 2022-09-30 DIAGNOSIS — T85618S Breakdown (mechanical) of other specified internal prosthetic devices, implants and grafts, sequela: Secondary | ICD-10-CM | POA: Diagnosis not present

## 2022-09-30 DIAGNOSIS — H6993 Unspecified Eustachian tube disorder, bilateral: Secondary | ICD-10-CM | POA: Diagnosis not present

## 2022-09-30 DIAGNOSIS — H9193 Unspecified hearing loss, bilateral: Secondary | ICD-10-CM | POA: Diagnosis not present

## 2022-09-30 DIAGNOSIS — H7403 Tympanosclerosis, bilateral: Secondary | ICD-10-CM | POA: Diagnosis not present

## 2022-09-30 DIAGNOSIS — H6983 Other specified disorders of Eustachian tube, bilateral: Secondary | ICD-10-CM | POA: Diagnosis not present

## 2022-10-13 ENCOUNTER — Other Ambulatory Visit (HOSPITAL_COMMUNITY): Payer: Self-pay

## 2022-10-15 ENCOUNTER — Other Ambulatory Visit: Payer: Self-pay

## 2022-10-15 ENCOUNTER — Other Ambulatory Visit (HOSPITAL_COMMUNITY): Payer: Self-pay

## 2022-10-15 DIAGNOSIS — H9190 Unspecified hearing loss, unspecified ear: Secondary | ICD-10-CM | POA: Diagnosis not present

## 2022-10-15 DIAGNOSIS — J342 Deviated nasal septum: Secondary | ICD-10-CM | POA: Diagnosis not present

## 2022-10-15 DIAGNOSIS — Z9622 Myringotomy tube(s) status: Secondary | ICD-10-CM | POA: Diagnosis not present

## 2022-10-15 MED ORDER — HYDROCHLOROTHIAZIDE 12.5 MG PO TABS
12.5000 mg | ORAL_TABLET | Freq: Every morning | ORAL | 2 refills | Status: DC
  Filled 2022-10-15 (×2): qty 30, 30d supply, fill #0

## 2022-10-15 MED ORDER — UBRELVY 100 MG PO TABS
100.0000 mg | ORAL_TABLET | ORAL | 2 refills | Status: DC | PRN
  Filled 2022-10-15: qty 16, 16d supply, fill #0
  Filled 2022-10-16: qty 16, 30d supply, fill #0

## 2022-10-16 ENCOUNTER — Other Ambulatory Visit (HOSPITAL_COMMUNITY): Payer: Self-pay

## 2022-10-16 ENCOUNTER — Other Ambulatory Visit: Payer: Self-pay

## 2022-10-22 ENCOUNTER — Institutional Professional Consult (permissible substitution): Payer: 59 | Admitting: Pulmonary Disease

## 2022-10-22 ENCOUNTER — Other Ambulatory Visit (HOSPITAL_COMMUNITY): Payer: Self-pay

## 2022-10-22 MED ORDER — BACLOFEN 20 MG PO TABS
20.0000 mg | ORAL_TABLET | Freq: Three times a day (TID) | ORAL | 1 refills | Status: AC
  Filled 2022-10-22: qty 270, 90d supply, fill #0
  Filled 2023-01-15: qty 270, 90d supply, fill #1

## 2022-11-09 ENCOUNTER — Other Ambulatory Visit (HOSPITAL_COMMUNITY): Payer: Self-pay

## 2022-11-16 ENCOUNTER — Other Ambulatory Visit (HOSPITAL_COMMUNITY): Payer: Self-pay

## 2022-11-16 ENCOUNTER — Other Ambulatory Visit: Payer: Self-pay

## 2022-11-16 MED ORDER — VITAMIN D (ERGOCALCIFEROL) 1.25 MG (50000 UNIT) PO CAPS
50000.0000 [IU] | ORAL_CAPSULE | ORAL | 2 refills | Status: DC
  Filled 2022-11-16: qty 4, 28d supply, fill #0
  Filled 2022-12-23: qty 4, 28d supply, fill #1
  Filled 2023-01-15: qty 4, 28d supply, fill #2

## 2022-11-16 MED ORDER — HYDROCHLOROTHIAZIDE 12.5 MG PO TABS
12.5000 mg | ORAL_TABLET | Freq: Every morning | ORAL | 5 refills | Status: DC
  Filled 2022-11-16: qty 30, 30d supply, fill #0
  Filled 2022-12-10: qty 30, 30d supply, fill #1
  Filled 2023-01-15: qty 30, 30d supply, fill #2
  Filled 2023-02-22 (×2): qty 30, 30d supply, fill #3
  Filled 2023-03-24: qty 30, 30d supply, fill #4
  Filled 2023-04-19: qty 30, 30d supply, fill #5

## 2022-11-16 MED ORDER — LEVOTHYROXINE SODIUM 200 MCG PO TABS
200.0000 ug | ORAL_TABLET | Freq: Every morning | ORAL | 5 refills | Status: AC
  Filled 2022-11-16: qty 30, 30d supply, fill #0
  Filled 2023-02-22 (×2): qty 30, 30d supply, fill #1
  Filled 2023-06-11: qty 30, 30d supply, fill #2
  Filled 2023-07-13: qty 30, 30d supply, fill #3
  Filled 2023-08-23: qty 30, 30d supply, fill #4
  Filled 2023-10-01: qty 30, 30d supply, fill #5

## 2022-11-16 MED ORDER — FLUTICASONE PROPIONATE 50 MCG/ACT NA SUSP
1.0000 | Freq: Every day | NASAL | 5 refills | Status: DC
  Filled 2022-11-16: qty 16, 60d supply, fill #0
  Filled 2023-01-15: qty 16, 60d supply, fill #1
  Filled 2023-03-24: qty 16, 60d supply, fill #2
  Filled 2023-05-18: qty 16, 60d supply, fill #3
  Filled 2023-07-13: qty 16, 60d supply, fill #4
  Filled 2023-10-01: qty 16, 60d supply, fill #5

## 2022-11-16 MED ORDER — MONTELUKAST SODIUM 10 MG PO TABS
10.0000 mg | ORAL_TABLET | Freq: Every day | ORAL | 2 refills | Status: DC
  Filled 2022-11-16: qty 30, 30d supply, fill #0
  Filled 2022-12-23: qty 30, 30d supply, fill #1
  Filled 2023-01-29: qty 30, 30d supply, fill #2

## 2022-12-03 ENCOUNTER — Other Ambulatory Visit: Payer: Self-pay | Admitting: Nurse Practitioner

## 2022-12-03 DIAGNOSIS — Z1231 Encounter for screening mammogram for malignant neoplasm of breast: Secondary | ICD-10-CM

## 2022-12-07 ENCOUNTER — Other Ambulatory Visit: Payer: Self-pay

## 2022-12-07 ENCOUNTER — Other Ambulatory Visit (HOSPITAL_COMMUNITY): Payer: Self-pay

## 2022-12-07 MED ORDER — LEVOTHYROXINE SODIUM 200 MCG PO TABS
ORAL_TABLET | ORAL | 1 refills | Status: DC
  Filled 2022-12-07 – 2022-12-16 (×3): qty 30, 30d supply, fill #0
  Filled 2023-01-15: qty 30, 30d supply, fill #1

## 2022-12-07 MED ORDER — LEVOTHYROXINE SODIUM 25 MCG PO TABS
ORAL_TABLET | ORAL | 1 refills | Status: DC
  Filled 2022-12-07: qty 30, 30d supply, fill #0
  Filled 2023-01-15: qty 30, 30d supply, fill #1

## 2022-12-10 ENCOUNTER — Other Ambulatory Visit (HOSPITAL_COMMUNITY): Payer: Self-pay

## 2022-12-10 ENCOUNTER — Other Ambulatory Visit: Payer: Self-pay

## 2022-12-10 MED ORDER — UBRELVY 100 MG PO TABS
ORAL_TABLET | ORAL | 2 refills | Status: DC
  Filled 2022-12-10: qty 16, 30d supply, fill #0
  Filled 2023-01-15: qty 16, 30d supply, fill #1
  Filled 2023-02-22 (×2): qty 16, 30d supply, fill #2

## 2022-12-11 ENCOUNTER — Other Ambulatory Visit: Payer: Self-pay

## 2022-12-16 ENCOUNTER — Other Ambulatory Visit: Payer: Self-pay

## 2022-12-16 ENCOUNTER — Other Ambulatory Visit (HOSPITAL_COMMUNITY): Payer: Self-pay

## 2022-12-23 ENCOUNTER — Other Ambulatory Visit: Payer: Self-pay

## 2022-12-23 ENCOUNTER — Other Ambulatory Visit (HOSPITAL_COMMUNITY): Payer: Self-pay

## 2022-12-23 MED ORDER — GABAPENTIN 400 MG PO CAPS
400.0000 mg | ORAL_CAPSULE | Freq: Two times a day (BID) | ORAL | 2 refills | Status: DC
  Filled 2022-12-23: qty 60, 30d supply, fill #0
  Filled 2023-01-29: qty 60, 30d supply, fill #1
  Filled 2023-04-06: qty 60, 30d supply, fill #2

## 2022-12-23 MED ORDER — SERTRALINE HCL 100 MG PO TABS
200.0000 mg | ORAL_TABLET | Freq: Every day | ORAL | 1 refills | Status: DC
  Filled 2022-12-23: qty 180, 90d supply, fill #0
  Filled 2023-05-10: qty 180, 90d supply, fill #1

## 2022-12-23 MED ORDER — HYDROXYZINE PAMOATE 25 MG PO CAPS
25.0000 mg | ORAL_CAPSULE | Freq: Two times a day (BID) | ORAL | 2 refills | Status: DC
  Filled 2022-12-23: qty 60, 30d supply, fill #0
  Filled 2023-01-29: qty 60, 30d supply, fill #1
  Filled 2023-03-06: qty 60, 30d supply, fill #2

## 2022-12-23 MED ORDER — AMITRIPTYLINE HCL 25 MG PO TABS
25.0000 mg | ORAL_TABLET | Freq: Every day | ORAL | 2 refills | Status: DC
  Filled 2022-12-23: qty 30, 30d supply, fill #0
  Filled 2023-01-29: qty 30, 30d supply, fill #1

## 2022-12-23 MED ORDER — GABAPENTIN 600 MG PO TABS
600.0000 mg | ORAL_TABLET | Freq: Every morning | ORAL | 5 refills | Status: DC
  Filled 2022-12-23: qty 30, 30d supply, fill #0
  Filled 2023-01-29: qty 30, 30d supply, fill #1
  Filled 2023-04-06: qty 30, 30d supply, fill #2
  Filled 2023-05-10: qty 30, 30d supply, fill #3
  Filled 2023-06-11: qty 30, 30d supply, fill #4
  Filled 2023-07-13: qty 30, 30d supply, fill #5

## 2023-01-01 DIAGNOSIS — Z1231 Encounter for screening mammogram for malignant neoplasm of breast: Secondary | ICD-10-CM

## 2023-01-05 ENCOUNTER — Other Ambulatory Visit: Payer: Self-pay | Admitting: Nurse Practitioner

## 2023-01-05 DIAGNOSIS — Z1231 Encounter for screening mammogram for malignant neoplasm of breast: Secondary | ICD-10-CM

## 2023-01-11 DIAGNOSIS — G4730 Sleep apnea, unspecified: Secondary | ICD-10-CM | POA: Insufficient documentation

## 2023-01-11 DIAGNOSIS — R0683 Snoring: Secondary | ICD-10-CM | POA: Diagnosis not present

## 2023-01-15 ENCOUNTER — Other Ambulatory Visit: Payer: Self-pay

## 2023-01-15 ENCOUNTER — Other Ambulatory Visit (HOSPITAL_COMMUNITY): Payer: Self-pay

## 2023-01-20 ENCOUNTER — Ambulatory Visit
Admission: RE | Admit: 2023-01-20 | Discharge: 2023-01-20 | Disposition: A | Discharge status: Home / Self Care | Payer: 59 | Source: Ambulatory Visit

## 2023-01-20 DIAGNOSIS — Z1231 Encounter for screening mammogram for malignant neoplasm of breast: Secondary | ICD-10-CM

## 2023-01-27 ENCOUNTER — Other Ambulatory Visit (HOSPITAL_COMMUNITY): Payer: Self-pay

## 2023-01-27 ENCOUNTER — Other Ambulatory Visit: Payer: Self-pay

## 2023-01-27 DIAGNOSIS — N3010 Interstitial cystitis (chronic) without hematuria: Secondary | ICD-10-CM | POA: Diagnosis not present

## 2023-01-27 MED ORDER — OXYBUTYNIN CHLORIDE ER 15 MG PO TB24
15.0000 mg | ORAL_TABLET | Freq: Every day | ORAL | 3 refills | Status: DC
  Filled 2023-01-27: qty 90, 90d supply, fill #0
  Filled 2023-07-27: qty 90, 90d supply, fill #1

## 2023-01-29 ENCOUNTER — Other Ambulatory Visit: Payer: Self-pay

## 2023-01-29 ENCOUNTER — Other Ambulatory Visit (HOSPITAL_COMMUNITY): Payer: Self-pay

## 2023-02-01 ENCOUNTER — Other Ambulatory Visit: Payer: Self-pay

## 2023-02-01 ENCOUNTER — Other Ambulatory Visit (HOSPITAL_COMMUNITY): Payer: Self-pay

## 2023-02-01 MED ORDER — OMEPRAZOLE 40 MG PO CPDR
40.0000 mg | DELAYED_RELEASE_CAPSULE | Freq: Every morning | ORAL | 3 refills | Status: DC
  Filled 2023-02-01: qty 30, 30d supply, fill #0
  Filled 2023-03-06: qty 30, 30d supply, fill #1
  Filled 2023-04-06: qty 30, 30d supply, fill #2
  Filled 2023-05-10: qty 30, 30d supply, fill #3

## 2023-02-03 ENCOUNTER — Ambulatory Visit: Payer: 59 | Admitting: Orthopaedic Surgery

## 2023-02-04 ENCOUNTER — Other Ambulatory Visit: Payer: Self-pay

## 2023-02-10 ENCOUNTER — Encounter (INDEPENDENT_AMBULATORY_CARE_PROVIDER_SITE_OTHER): Payer: Self-pay | Admitting: Internal Medicine

## 2023-02-13 ENCOUNTER — Other Ambulatory Visit (HOSPITAL_COMMUNITY): Payer: Self-pay

## 2023-02-18 ENCOUNTER — Other Ambulatory Visit (HOSPITAL_COMMUNITY): Payer: Self-pay

## 2023-02-18 MED ORDER — TRAMADOL HCL 50 MG PO TABS
ORAL_TABLET | ORAL | 0 refills | Status: AC
  Filled 2023-02-18: qty 10, 5d supply, fill #0

## 2023-02-19 ENCOUNTER — Other Ambulatory Visit: Payer: Self-pay

## 2023-02-19 ENCOUNTER — Other Ambulatory Visit (HOSPITAL_COMMUNITY): Payer: Self-pay

## 2023-02-22 ENCOUNTER — Other Ambulatory Visit (HOSPITAL_COMMUNITY): Payer: Self-pay

## 2023-02-22 ENCOUNTER — Other Ambulatory Visit: Payer: Self-pay

## 2023-02-22 MED ORDER — LEVOTHYROXINE SODIUM 25 MCG PO TABS
25.0000 ug | ORAL_TABLET | Freq: Every day | ORAL | 5 refills | Status: AC
  Filled 2023-02-22: qty 30, 30d supply, fill #0
  Filled 2023-03-24: qty 30, 30d supply, fill #1
  Filled 2023-07-27: qty 30, 30d supply, fill #2

## 2023-02-22 MED ORDER — LEVOTHYROXINE SODIUM 25 MCG PO TABS
25.0000 ug | ORAL_TABLET | Freq: Every morning | ORAL | 1 refills | Status: AC
  Filled 2023-02-22 – 2023-07-27 (×3): qty 30, 30d supply, fill #0

## 2023-03-03 DIAGNOSIS — R350 Frequency of micturition: Secondary | ICD-10-CM | POA: Diagnosis not present

## 2023-03-03 DIAGNOSIS — Z905 Acquired absence of kidney: Secondary | ICD-10-CM | POA: Diagnosis not present

## 2023-03-03 DIAGNOSIS — G894 Chronic pain syndrome: Secondary | ICD-10-CM | POA: Diagnosis not present

## 2023-03-03 DIAGNOSIS — N3941 Urge incontinence: Secondary | ICD-10-CM | POA: Diagnosis not present

## 2023-03-03 DIAGNOSIS — N3010 Interstitial cystitis (chronic) without hematuria: Secondary | ICD-10-CM | POA: Diagnosis not present

## 2023-03-06 ENCOUNTER — Other Ambulatory Visit (HOSPITAL_COMMUNITY): Payer: Self-pay

## 2023-03-08 ENCOUNTER — Other Ambulatory Visit (HOSPITAL_COMMUNITY): Payer: Self-pay

## 2023-03-08 ENCOUNTER — Other Ambulatory Visit: Payer: Self-pay

## 2023-03-08 MED ORDER — LEVOTHYROXINE SODIUM 200 MCG PO TABS
200.0000 ug | ORAL_TABLET | Freq: Every day | ORAL | 1 refills | Status: DC
  Filled 2023-03-08 – 2023-03-24 (×2): qty 30, 30d supply, fill #0
  Filled 2023-05-10: qty 30, 30d supply, fill #1

## 2023-03-08 MED ORDER — VITAMIN D (ERGOCALCIFEROL) 1.25 MG (50000 UNIT) PO CAPS
50000.0000 [IU] | ORAL_CAPSULE | ORAL | 11 refills | Status: AC
  Filled 2023-03-08: qty 4, 28d supply, fill #0
  Filled 2023-04-06: qty 4, 28d supply, fill #1
  Filled 2023-05-10: qty 4, 28d supply, fill #2
  Filled 2023-06-11: qty 4, 28d supply, fill #3
  Filled 2023-07-13: qty 4, 28d supply, fill #4
  Filled 2023-10-29: qty 4, 28d supply, fill #5
  Filled 2023-12-07: qty 4, 28d supply, fill #6
  Filled 2024-01-06: qty 4, 28d supply, fill #7
  Filled 2024-01-31: qty 4, 28d supply, fill #8
  Filled 2024-03-04: qty 4, 28d supply, fill #9

## 2023-03-08 MED ORDER — MONTELUKAST SODIUM 10 MG PO TABS
10.0000 mg | ORAL_TABLET | Freq: Every day | ORAL | 2 refills | Status: DC
  Filled 2023-03-08: qty 30, 30d supply, fill #0
  Filled 2023-04-06: qty 30, 30d supply, fill #1
  Filled 2023-05-10: qty 30, 30d supply, fill #2

## 2023-03-09 DIAGNOSIS — G4733 Obstructive sleep apnea (adult) (pediatric): Secondary | ICD-10-CM | POA: Diagnosis not present

## 2023-03-11 DIAGNOSIS — G4733 Obstructive sleep apnea (adult) (pediatric): Secondary | ICD-10-CM | POA: Diagnosis not present

## 2023-03-17 ENCOUNTER — Encounter: Payer: 59 | Admitting: Family Medicine

## 2023-03-24 ENCOUNTER — Other Ambulatory Visit (HOSPITAL_COMMUNITY): Payer: Self-pay

## 2023-03-25 ENCOUNTER — Other Ambulatory Visit: Payer: Self-pay

## 2023-03-25 ENCOUNTER — Encounter: Payer: Self-pay | Admitting: Obstetrics & Gynecology

## 2023-03-25 ENCOUNTER — Ambulatory Visit (INDEPENDENT_AMBULATORY_CARE_PROVIDER_SITE_OTHER): Payer: 59 | Admitting: Obstetrics & Gynecology

## 2023-03-25 ENCOUNTER — Other Ambulatory Visit (HOSPITAL_COMMUNITY)
Admission: RE | Admit: 2023-03-25 | Discharge: 2023-03-25 | Disposition: A | Discharge status: Home / Self Care | Payer: 59 | Source: Ambulatory Visit | Attending: Obstetrics & Gynecology | Admitting: Obstetrics & Gynecology

## 2023-03-25 VITALS — BP 151/85 | HR 67 | Ht 66.0 in | Wt 286.7 lb

## 2023-03-25 DIAGNOSIS — Z01419 Encounter for gynecological examination (general) (routine) without abnormal findings: Secondary | ICD-10-CM | POA: Insufficient documentation

## 2023-03-25 DIAGNOSIS — N3010 Interstitial cystitis (chronic) without hematuria: Secondary | ICD-10-CM | POA: Diagnosis not present

## 2023-03-25 DIAGNOSIS — Z6841 Body Mass Index (BMI) 40.0 and over, adult: Secondary | ICD-10-CM | POA: Diagnosis not present

## 2023-03-25 DIAGNOSIS — N809 Endometriosis, unspecified: Secondary | ICD-10-CM | POA: Diagnosis not present

## 2023-03-25 MED ORDER — NORETHINDRONE ACET-ETHINYL EST 1.5-30 MG-MCG PO TABS
1.0000 | ORAL_TABLET | Freq: Every day | ORAL | 11 refills | Status: DC
Start: 2023-03-25 — End: 2023-05-03

## 2023-03-25 NOTE — Progress Notes (Signed)
Patient ID: Brenda Ruiz, female   DOB: 04-22-1976, 47 y.o.   MRN: [id number]  Chief Complaint  Patient presents with   Gynecologic Exam    HPI Brenda Ruiz is a 47 y.o. female.  [id number] No LMP recorded (lmp unknown). (Menstrual status: Oral contraceptives). She uses continuous OCP and has no BTB, prescribed for a presumed diagnosis of endometriosis for many years. Followed by Dr. Logan Ruiz for IC and sx are intermittent. Last pap is not available but may have been 2-3 years ago and was normal. She is up to date with yearly mammogram HPI  Past Medical History:  Diagnosis Date   Acute upper respiratory infection, unspecified    Anxiety    Anxiety disorder, unspecified    Arthritis    Bilateral primary osteoarthritis of knee    right    Blood transfusion without reported diagnosis 1988   Left nephrectomy   BMI 45.0-49.9, adult (HCC)    Chronic otitis media    tubes in ears   Chronic pain syndrome    Cough    Depression    Endometriosis    Fever, unspecified    Fibromyalgia    GERD without esophagitis    Hypothyroid    IBS (irritable bowel syndrome)    Interstitial cystitis (chronic) without hematuria    Mixed hyperlipidemia    Mixed irritable bowel syndrome    Morbid obesity due to excess calories (HCC)    Other vitamin B12 deficiency anemias    Panic disorder (episodic paroxysmal anxiety)    Solitary right kidney    TMJ (temporomandibular joint disorder)    Trigeminal neuralgia    Trigeminal neuralgia    Vitamin D deficiency     Past Surgical History:  Procedure Laterality Date   bladder stent  1999   carpel tunnel releae Right Mardh 2020   NEPHRECTOMY Left 1989   tubes in ears  06/2017    Family History  Problem Relation Age of Onset   Hypertension Mother    Hyperlipidemia Mother    Stroke Maternal Grandmother    Diabetes type II Maternal Grandfather    Hypertension Maternal Grandfather    Neuropathy Neg Hx    Colon cancer Neg Hx    Esophageal cancer  Neg Hx     Social History Social History   Tobacco Use   Smoking status: Former   Smokeless tobacco: Never  Advertising account planner   Vaping status: Never Used  Substance Use Topics   Alcohol use: Yes    Comment: ocassionally/1-2 times a week    Drug use: Not Currently    Not on File  Current Outpatient Medications  Medication Sig Dispense Refill   baclofen (LIORESAL) 20 MG tablet Take 1 tablet (20 mg total) by mouth every 8 (eight) hours with food or milk. 270 tablet 1   Blood Pressure Monitoring (OMRON 3 SERIES BP MONITOR) DEVI Use as directed 1 each 0   cetirizine (ZYRTEC) 10 MG tablet Take 10 mg by mouth daily.     Cholecalciferol (VITAMIN D3 PO) Take 2,000 Units by mouth daily.     cyanocobalamin (,VITAMIN B-12,) 1000 MCG/ML injection Inject 1,000 mcg into the muscle every 30 (thirty) days.      cyclobenzaprine (FLEXERIL) 10 MG tablet Take 1 tablet (10 mg total) by mouth at bedtime as needed. 30 tablet 2   diazepam (VALIUM) 5 MG/ML solution Take by mouth every 8 (eight) hours as needed for anxiety.     diclofenac Sodium (VOLTAREN) 1 %  GEL Apply topically up to four times a day as needed for pain. 100 g 2   dicyclomine (BENTYL) 20 MG tablet Take 1 tablet (20 mg total) by mouth 3 (three) times daily. 90 tablet 5   fluticasone (FLONASE) 50 MCG/ACT nasal spray Place 1 spray into both nostrils daily. 16 g 5   gabapentin (NEURONTIN) 400 MG capsule Take 1 capsule (400 mg total) by mouth 2 (two) times daily (at noon and at night) 60 capsule 2   gabapentin (NEURONTIN) 600 MG tablet TAKE 1 TABLET BY MOUTH EVERY MORNING 30 tablet 2   hydrochlorothiazide (HYDRODIURIL) 12.5 MG tablet Take 1 tablet (12.5 mg total) by mouth in the morning. 30 tablet 5   hydrOXYzine (VISTARIL) 25 MG capsule Take 1 capsule (25 mg total) by mouth every 12 (twelve) hours. 60 capsule 2   levothyroxine (SYNTHROID) 200 MCG tablet Take with 75 mcg tablet by mouth every morning on an empty stomach.     levothyroxine (SYNTHROID)  200 MCG tablet Take 1 tablet (200 mcg total) by mouth in the morning on an empty stomach. 30 tablet 5   levothyroxine (SYNTHROID) 200 MCG tablet Take 1 tablet (200 mcg) by mouth in the morning on an empty stomach (with to equal ) 30 tablet 1   levothyroxine (SYNTHROID) 25 MCG tablet Take 1 tablet (along with 200 mcg tablet for total) by mouth in the morning on an empty stomach 30 tablet 5   magnesium oxide (MAG-OX) 400 MG tablet Take 400 mg by mouth daily.     Meth-Hyo-M Bl-Na Phos-Ph Sal (URIBEL) 118 MG CAPS Take 1 capsule (118 mg total) by mouth 4 (four) times daily as needed. 120 capsule 99   montelukast (SINGULAIR) 10 MG tablet Take 1 tablet (10 mg) by mouth daily. 30 tablet 2   Norethindrone Acetate-Ethinyl Estradiol (LARIN 1.5/30) 1.5-30 MG-MCG tablet TAKE 1 ACTIVE TABLET BY MOUTH ONCE A DAY CONTINUOUSLY. 63 tablet 2   Omega-3 Fatty Acids (SALMON OIL-1000 PO) Take 1 capsule by mouth daily.     omeprazole (PRILOSEC) 40 MG capsule TAKE 1 CAPSULE BY MOUTH 30 MINUTES BEFORE MORNING MEAL 30 capsule 2   ondansetron (ZOFRAN) 4 MG tablet Take 1 tablet (4 mg total) by mouth every 8 (eight) hours as needed for nausea. 30 tablet 1   oxybutynin (DITROPAN XL) 15 MG 24 hr tablet Take 1 tablet (15 mg total) by mouth daily. 90 tablet 3   sertraline (ZOLOFT) 100 MG tablet Take 100 mg by mouth daily.     traMADol (ULTRAM) 50 MG tablet Take 1 tablet (50 mg total) by mouth 2 (two) times a day as needed for moderate pain (4-6) for up to 5 days. 10 tablet 0   Ubrogepant (UBRELVY) 100 MG TABS Take 1 tablet (100 mg total) by mouth as needed. May repeat in two hours if symptoms persist as needed 16 tablet 2   Vitamin D, Ergocalciferol, (DRISDOL) 1.25 MG (50000 UNIT) CAPS capsule Take 1 capsule by mouth once a week. 4 capsule 11   dicyclomine (BENTYL) 10 MG capsule Take 10 mg by mouth 4 (four) times daily as needed.  (Patient not taking: Reported on 03/25/2023)     ezetimibe (ZETIA) 10 MG tablet TAKE 1  TABLET BY MOUTH ONCE A DAY 30 tablet 2   ezetimibe (ZETIA) 10 MG tablet TAKE 1 TABLET BY MOUTH ONCE A DAY 30 tablet 2   gabapentin (NEURONTIN) 400 MG capsule TAKE 1 CAPSULE BY MOUTH TWO TIMES DAILY (NOON  AND NIGHT AS DIRECTED) 60 capsule 2   gabapentin (NEURONTIN) 600 MG tablet TAKE 1 TABLET BY MOUTH EVERY MORNING 30 tablet 2   gabapentin (NEURONTIN) 600 MG tablet TAKE 1 TABLET BY MOUTH EVERY MORNING 60 tablet 0   gabapentin (NEURONTIN) 600 MG tablet Take 1 tablet (600 mg total) by mouth every morning. 30 tablet 5   levothyroxine (SYNTHROID) 25 MCG tablet TAKE 1 TABLET BY MOUTH ONCE DAILY IN THE MORNING ON AN EMPTY STOMACH 30 tablet 1   levothyroxine (SYNTHROID) 25 MCG tablet TAKE 1 TABLET BY MOUTH ONCE DAILY IN THE MORNING ON AN EMPTY STOMACH 30 tablet 2   levothyroxine (SYNTHROID) 25 MCG tablet Take 1 tablet (200 mcg with 25 mcg total ) by mouth in the morning on an empty stomach. 30 tablet 1   Norethin Ace-Eth Estrad-FE (JUNEL FE 1.5/30 PO) Take 1 tablet by mouth daily.     Norethindrone Acetate-Ethinyl Estradiol (LARIN 1.5/30) 1.5-30 MG-MCG tablet TAKE 1 TABLET BY MOUTH ONCE A DAY CONTINUOUSLY. 63 tablet 3   Norethindrone Acetate-Ethinyl Estradiol (LOESTRIN) 1.5-30 MG-MCG tablet Take 1 tablet by mouth daily.  Take continuously 21 tablet 11   omeprazole (PRILOSEC) 40 MG capsule TAKE 1 CAPSULE BY MOUTH 30 MINUTES BEFORE MORNING MEAL 30 capsule 2   omeprazole (PRILOSEC) 40 MG capsule TAKE 1 CAPSULE BY MOUTH 30 MINUTES BEFORE MORNING MEAL 30 capsule 2   omeprazole (PRILOSEC) 40 MG capsule TAKE 1 CAPSULE BY MOUTH 30 MINUTES BEFORE MORNING MEAL 30 capsule 2   omeprazole (PRILOSEC) 40 MG capsule TAKE 1 CAPSULE BY MOUTH 30 MINUTES BEFORE MORNING MEAL 30 capsule 3   omeprazole (PRILOSEC) 40 MG capsule Take 1 capsule (40 mg total) by mouth every morning  30 minutes before breakfast. 30 capsule 3   oxybutynin (DITROPAN) 5 MG tablet Take 1 tablet (5 mg total) by mouth 3 (three) times daily. (Patient  not taking: Reported on 03/25/2023) 90 tablet 4   oxybutynin (DITROPAN-XL) 10 MG 24 hr tablet Take 1 tablet (10 mg total) by mouth daily. (Patient not taking: Reported on 03/25/2023) 30 tablet 5   promethazine (PHENERGAN) 25 MG tablet Take 25 mg by mouth daily as needed for nausea or vomiting. (Patient not taking: Reported on 03/25/2023)     Red Yeast Rice 600 MG CAPS Take by mouth. (Patient not taking: Reported on 03/25/2023)     sertraline (ZOLOFT) 100 MG tablet TAKE 2 TABLETS BY MOUTH DAILY 180 tablet 1   sertraline (ZOLOFT) 100 MG tablet Take 2 tablets (200 mg total) by mouth daily. 180 tablet 1   No current facility-administered medications for this visit.    Review of Systems Review of Systems  Constitutional: Negative.   Respiratory: Negative.    Cardiovascular: Negative.   Gastrointestinal: Negative.   Genitourinary:  Negative for menstrual problem, pelvic pain, vaginal bleeding and vaginal discharge. Frequency: occasionally.   Blood pressure (!) 151/85, pulse 67, height 5\' 6"  (1.676 m), weight 286 lb 11.2 oz (130 kg).  Physical Exam Physical Exam Vitals and nursing note reviewed. Exam conducted with a chaperone present.  Constitutional:      Appearance: She is obese. She is not ill-appearing.  HENT:     Head: Normocephalic and atraumatic.  Cardiovascular:     Rate and Rhythm: Normal rate.  Pulmonary:     Effort: Pulmonary effort is normal.  Chest:  Breasts:    Right: Normal.     Left: Normal.  Abdominal:     General: Abdomen is flat.  Palpations: Abdomen is soft.  Genitourinary:    General: Normal vulva.     Exam position: Lithotomy position.     Vagina: Normal.     Cervix: Normal.     Uterus: Normal.      Adnexa: Right adnexa normal and left adnexa normal.  Lymphadenopathy:     Upper Body:     Right upper body: No axillary adenopathy.     Left upper body: No axillary adenopathy.  Skin:    General: Skin is warm and dry.  Neurological:     General: No focal  deficit present.     Mental Status: She is alert.  Psychiatric:        Mood and Affect: Mood normal.     Data Reviewed CT imaging  Assessment Well woman exam with routine gynecological exam - Plan: Cytology - PAP( [place]), Norethindrone Acetate-Ethinyl Estradiol (LOESTRIN) 1.5-30 MG-MCG tablet  Endometriosis  Interstitial cystitis (chronic) without hematuria  BMI 45.0-49.9, adult (HCC)    Plan Meds ordered this encounter  Medications   Norethindrone Acetate-Ethinyl Estradiol (LOESTRIN) 1.5-30 MG-MCG tablet    Sig: Take 1 tablet by mouth daily.  Take continuously    Dispense:  21 tablet    Refill:  11   Annual exams, mammograms    Scheryl Darter 03/25/2023, 2:38 PM

## 2023-03-30 DIAGNOSIS — E038 Other specified hypothyroidism: Secondary | ICD-10-CM | POA: Diagnosis not present

## 2023-03-30 DIAGNOSIS — F419 Anxiety disorder, unspecified: Secondary | ICD-10-CM | POA: Diagnosis not present

## 2023-03-31 DIAGNOSIS — G4733 Obstructive sleep apnea (adult) (pediatric): Secondary | ICD-10-CM | POA: Diagnosis not present

## 2023-04-01 ENCOUNTER — Encounter: Payer: Self-pay | Admitting: Obstetrics & Gynecology

## 2023-04-01 LAB — CYTOLOGY - PAP
Comment: NEGATIVE
Diagnosis: UNDETERMINED — AB
High risk HPV: NEGATIVE

## 2023-04-05 DIAGNOSIS — M546 Pain in thoracic spine: Secondary | ICD-10-CM | POA: Diagnosis not present

## 2023-04-05 DIAGNOSIS — M542 Cervicalgia: Secondary | ICD-10-CM | POA: Diagnosis not present

## 2023-04-05 DIAGNOSIS — M9901 Segmental and somatic dysfunction of cervical region: Secondary | ICD-10-CM | POA: Diagnosis not present

## 2023-04-05 DIAGNOSIS — M9902 Segmental and somatic dysfunction of thoracic region: Secondary | ICD-10-CM | POA: Diagnosis not present

## 2023-04-06 ENCOUNTER — Other Ambulatory Visit: Payer: Self-pay

## 2023-04-06 ENCOUNTER — Other Ambulatory Visit (HOSPITAL_COMMUNITY): Payer: Self-pay

## 2023-04-06 MED ORDER — UBRELVY 100 MG PO TABS
ORAL_TABLET | ORAL | 2 refills | Status: DC
Start: 2023-04-06 — End: 2024-03-28
  Filled 2023-04-06: qty 16, 30d supply, fill #0
  Filled 2023-05-10: qty 16, 30d supply, fill #1
  Filled 2023-06-11: qty 16, 30d supply, fill #2

## 2023-04-06 MED ORDER — HYDROXYZINE PAMOATE 25 MG PO CAPS
25.0000 mg | ORAL_CAPSULE | Freq: Two times a day (BID) | ORAL | 2 refills | Status: DC
  Filled 2023-04-06: qty 60, 30d supply, fill #0
  Filled 2023-05-10: qty 60, 30d supply, fill #1
  Filled 2023-06-11: qty 60, 30d supply, fill #2

## 2023-04-12 ENCOUNTER — Other Ambulatory Visit (HOSPITAL_COMMUNITY): Payer: Self-pay

## 2023-04-12 ENCOUNTER — Other Ambulatory Visit: Payer: Self-pay

## 2023-04-18 DIAGNOSIS — M899 Disorder of bone, unspecified: Secondary | ICD-10-CM | POA: Insufficient documentation

## 2023-04-18 DIAGNOSIS — N809 Endometriosis, unspecified: Secondary | ICD-10-CM | POA: Insufficient documentation

## 2023-04-18 DIAGNOSIS — Z789 Other specified health status: Secondary | ICD-10-CM | POA: Insufficient documentation

## 2023-04-18 DIAGNOSIS — Z79899 Other long term (current) drug therapy: Secondary | ICD-10-CM | POA: Insufficient documentation

## 2023-04-18 NOTE — Progress Notes (Unsigned)
Patient: Brenda Ruiz  Service Category: E/M  Provider: Oswaldo Done, MD  DOB: 08/16/1972  DOS: 04/19/2023  Referring Provider: Jodi Marble, NP  MRN: [id number]  Setting: Ambulatory outpatient  PCP: Jodi Marble, NP  Type: New Patient  Specialty: Interventional Pain Management    Location: Office  Delivery: Face-to-face     Primary Reason(s) for Visit: Encounter for initial evaluation of one or more chronic problems (new to examiner) potentially causing chronic pain, and posing a threat to normal musculoskeletal function. (Level of risk: High) CC: No chief complaint on file.  HPI  Brenda Ruiz is a 47 y.o. year old, female patient, who comes for the first time to our practice referred by Jodi Marble, NP for our initial evaluation of his chronic pain. He has Pancreatitis, acute; Insulin dependent diabetes mellitus; Alcohol abuse; ARF (acute renal failure) (HCC); Leukocytosis; Acute on chronic pancreatitis (HCC); Hypertriglyceridemia; Atypical chest pain; Liver lesion; Polysubstance abuse (HCC); Left-sided chest pain; Uncontrolled type 2 diabetes mellitus with hyperglycemia, with long-term current use of insulin (HCC); Elevated lipase; Hypotension; Family history of premature CAD; History of substance use disorder; Alcohol use disorder; Tobacco use disorder; Hypokalemia; Non-ST elevation (NSTEMI) myocardial infarction [facility]); Essential hypertension; Suicidal ideation; Chronic pain syndrome; Pharmacologic therapy; Disorder of skeletal system; Problems influencing health status; Type 2 diabetes mellitus treated with insulin (HCC); Abnormal liver enzymes; Anesthesia of skin; Arthritis; Chronic fatigue; Depression; Diabetic polyneuropathy associated with type 2 diabetes mellitus (HCC); Difficulty sleeping; GERD (gastroesophageal reflux disease); Gout; MRSA (methicillin resistant staphylococcus aureus) pneumonia (HCC); MRSA bacteremia; Neck pain; Chronic low back pain; Tobacco abuse; AKI  (acute kidney injury) (HCC); Glucose intolerance (impaired glucose tolerance); Chronic pancreatitis due to chronic alcoholism (HCC); and Elevated C-reactive protein (CRP) on their problem list. Today he comes in for evaluation of his No chief complaint on file.  Pain Assessment: Location:     Radiating:   Onset:   Duration:   Quality:   Severity:  /10 (subjective, self-reported pain score)  Effect on ADL:   Timing:   Modifying factors:   BP:    HR:    Onset and Duration: {Hx; Onset and Duration:210120511} Cause of pain: {Hx; Cause:210120521} Severity: {Pain Severity:210120502} Timing: {Symptoms; Timing:210120501} Aggravating Factors: {Causes; Aggravating pain factors:210120507} Alleviating Factors: {Causes; Alleviating Factors:210120500} Associated Problems: {Hx; Associated problems:210120515} Quality of Pain: {Hx; Symptom quality or Descriptor:210120531} Previous Examinations or Tests: {Hx; Previous examinations or test:210120529} Previous Treatments: {Hx; Previous Treatment:210120503}  Brenda Ruiz is being evaluated for possible interventional pain management therapies for the treatment of his chronic pain.   ***  Brenda Ruiz has been informed that this initial visit was an evaluation only.  On the follow up appointment I will go over the results, including ordered tests and available interventional therapies. At that time he will have the opportunity to decide whether to proceed with offered therapies or not. In the event that Brenda Ruiz prefers avoiding interventional options, this will conclude our involvement in the case.  Medication management recommendations may be provided upon request.  Patient informed that diagnostic tests may be ordered to assist in identifying underlying causes, narrow the list of differential diagnoses and aid in determining candidacy for (or contraindications to) planned therapeutic interventions.  Historic Controlled Substance Pharmacotherapy Review   PMP and historical list of controlled substances: ***  Most recently prescribed opioid analgesics:   *** MME/day: *** mg/day  Historical Monitoring: The patient  reports no history of drug use. List of prior UDS Testing: Lab Results  Component  Value Date   MDMA POSITIVE (A) 12/16/2022   MDMA NONE DETECTED 08/18/2022   MDMA NONE DETECTED 12/14/2015   MDMA NONE DETECTED 02/14/2015   MDMA NEGATIVE 10/18/2013   COCAINSCRNUR NONE DETECTED 12/16/2022   COCAINSCRNUR NONE DETECTED 08/18/2022   COCAINSCRNUR NONE DETECTED 12/14/2015   COCAINSCRNUR NONE DETECTED 02/14/2015   COCAINSCRNUR POSITIVE 10/18/2013   PCPSCRNUR NONE DETECTED 12/16/2022   PCPSCRNUR NONE DETECTED 08/18/2022   PCPSCRNUR NONE DETECTED 12/14/2015   PCPSCRNUR NONE DETECTED 02/14/2015   PCPSCRNUR NEGATIVE 10/18/2013   THCU POSITIVE (A) 12/16/2022   THCU POSITIVE (A) 08/18/2022   THCU NONE DETECTED 12/14/2015   THCU NONE DETECTED 02/14/2015   THCU POSITIVE 10/18/2013   ETH <10 12/16/2022   ETH <10 08/18/2022   ETH 145 (H) 02/25/2018   ETH <5 12/14/2015   Historical Background Evaluation: [place] PMP: PDMP reviewed during this encounter. Review of the past 19-months conducted.             PMP NARX Score Report:  Narcotic: 020 Sedative: 010 Stimulant: 000 [place] Department of public safety, offender search: Engineer, mining Information) Non-contributory Risk Assessment Profile: Aberrant behavior: None observed or detected today Risk factors for fatal opioid overdose: None identified today PMP NARX Overdose Risk Score: 330 Fatal overdose hazard ratio (HR): Calculation deferred Non-fatal overdose hazard ratio (HR): Calculation deferred Risk of opioid abuse or dependence: 0.7-3.0% with doses <= 36 MME/day and 6.1-26% with doses >= 120 MME/day. Substance use disorder (SUD) risk level: See below Personal History of Substance Abuse (SUD-Substance use disorder):  Alcohol:    Illegal Drugs:    Rx Drugs:    ORT Risk Level calculation:     ORT Scoring interpretation table:  Score <3 = Low Risk for SUD  Score between 4-7 = Moderate Risk for SUD  Score >8 = High Risk for Opioid Abuse   PHQ-2 Depression Scale:  Total score:    PHQ-2 Scoring interpretation table: (Score and probability of major depressive disorder)  Score 0 = No depression  Score 1 = 15.4% Probability  Score 2 = 21.1% Probability  Score 3 = 38.4% Probability  Score 4 = 45.5% Probability  Score 5 = 56.4% Probability  Score 6 = 78.6% Probability   PHQ-9 Depression Scale:  Total score:    PHQ-9 Scoring interpretation table:  Score 0-4 = No depression  Score 5-9 = Mild depression  Score 10-14 = Moderate depression  Score 15-19 = Moderately severe depression  Score 20-27 = Severe depression (2.4 times higher risk of SUD and 2.89 times higher risk of overuse)   Pharmacologic Plan: As per protocol, I have not taken over any controlled substance management, pending the results of ordered tests and/or consults.            Initial impression: Pending review of available data and ordered tests.  Meds   Current Outpatient Medications:    albuterol (PROVENTIL HFA;VENTOLIN HFA) 108 (90 Base) MCG/ACT inhaler, Inhale 1-2 puffs every 6 (six) hours as needed into the lungs for wheezing or shortness of breath., Disp: 1 Inhaler, Rfl: 0   amLODipine (NORVASC) 10 MG tablet, Take 10 mg by mouth daily., Disp: , Rfl:    aspirin EC 81 MG tablet, Take 1 tablet (81 mg total) by mouth daily. Swallow whole., Disp: 30 tablet, Rfl: 12   atorvastatin (LIPITOR) 80 MG tablet, Take 80 mg by mouth daily., Disp: , Rfl:    carvedilol (COREG) 25 MG tablet, Take 1 tablet (25 mg total) by mouth 2 (  two) times daily., Disp: 180 tablet, Rfl: 3   clopidogrel (PLAVIX) 75 MG tablet, Take 1 tablet (75 mg total) by mouth daily., Disp: 90 tablet, Rfl: 3   fenofibrate (TRICOR) 145 MG tablet, Take 145 mg by mouth daily., Disp: , Rfl:    fenofibrate 160 MG tablet, Take 1 tablet (160 mg total) by  mouth daily. (Patient not taking: Reported on 12/16/2022), Disp: 30 tablet, Rfl: 0   folic acid (FOLVITE) 1 MG tablet, Take 1 mg by mouth daily. (Patient not taking: Reported on 01/14/2023), Disp: , Rfl:    gabapentin (NEURONTIN) 800 MG tablet, Take 800 mg by mouth 3 (three) times daily., Disp: , Rfl:    hydrochlorothiazide (HYDRODIURIL) 50 MG tablet, Take 50 mg by mouth daily., Disp: , Rfl:    hydrOXYzine (ATARAX) 10 MG tablet, Take 10 mg by mouth 3 (three) times daily. (Patient not taking: Reported on 01/14/2023), Disp: , Rfl:    hyoscyamine (LEVSIN, ANASPAZ) 0.125 MG tablet, Take 1 tablet (0.125 mg total) by mouth every 6 (six) hours as needed for cramping. (Patient not taking: Reported on 10/14/2022), Disp: 30 tablet, Rfl: 0   JARDIANCE 25 MG TABS tablet, Take 25 mg by mouth daily., Disp: , Rfl:    losartan (COZAAR) 100 MG tablet, Take 1 tablet (100 mg total) by mouth daily., Disp: 30 tablet, Rfl: 0   magnesium oxide (MAG-OX) 400 MG tablet, Take 400 mg by mouth daily. (Patient not taking: Reported on 10/14/2022), Disp: , Rfl:    Multiple Vitamin (MULTIVITAMIN WITH MINERALS) TABS tablet, Take 1 tablet by mouth daily. (Patient not taking: Reported on 10/14/2022), Disp: , Rfl:    nicotine (NICODERM CQ - DOSED IN MG/24 HOURS) 21 mg/24hr patch, Place 1 patch (21 mg total) onto the skin daily., Disp: 28 patch, Rfl: 0   omega-3 acid ethyl esters (LOVAZA) 1 g capsule, Take 2 g by mouth 2 (two) times daily. (Patient not taking: Reported on 10/14/2022), Disp: , Rfl:    omeprazole (PRILOSEC) 20 MG capsule, Take 20 mg by mouth daily as needed., Disp: , Rfl:    spironolactone (ALDACTONE) 25 MG tablet, Take 25 mg by mouth every morning. (Patient not taking: Reported on 01/14/2023), Disp: , Rfl:    thiamine 100 MG tablet, Take 1 tablet (100 mg total) by mouth daily. (Patient not taking: Reported on 10/14/2022), Disp: , Rfl:   Imaging Review  Ankle Imaging: Ankle-R DG Complete: Results for orders placed during the  hospital encounter of 07/29/15 DG Ankle Complete Right  Narrative CLINICAL DATA:  Posterior ankle and calcaneus pain. No known injury.  EXAM: RIGHT ANKLE - COMPLETE 3+ VIEW  COMPARISON:  None.  FINDINGS: Bone mineralization is normal. No fracture line or displaced fracture fragment seen. No acute- appearing cortical irregularity or osseous lesion. Ankle mortise is symmetric. No significant arthropathy at the tibiotalar joint space. Mild degenerative spurring noted within the midfoot. Adjacent soft tissues are unremarkable.  IMPRESSION: 1. Perhaps mild degenerative change within the midfoot. No significant degenerative change at the tibiotalar joint space. 2. No acute findings.   Electronically Signed By: Bary Richard M.D. On: 08/04/2015 10:33  Elbow Imaging: Elbow-L DG Complete: Results for orders placed during the hospital encounter of 10/07/21 DG Elbow Complete Left  Narrative CLINICAL DATA:  Fall.  Fall 2 weeks ago  EXAM: LEFT ELBOW - COMPLETE 3+ VIEW  COMPARISON:  None.  FINDINGS: Fracture through the neck of the radial head. Fractures is horizontal and parallel to the articular surface. Fracture  does not enter the articular surface. No dislocation.  IMPRESSION: Horizontal fracture of the neck of the radial head.   Electronically Signed By: Genevive Bi M.D. On: 10/07/2021 16:42  Complexity Note: Imaging results reviewed.                         ROS  Cardiovascular: {Hx; Cardiovascular History:210120525} Pulmonary or Respiratory: {Hx; Pumonary and/or Respiratory History:210120523} Neurological: {Hx; Neurological:210120504} Psychological-Psychiatric: {Hx; Psychological-Psychiatric History:210120512} Gastrointestinal: {Hx; Gastrointestinal:210120527} Genitourinary: {Hx; Genitourinary:210120506} Hematological: {Hx; Hematological:210120510} Endocrine: {Hx; Endocrine history:210120509} Rheumatologic: {Hx;  Rheumatological:210120530} Musculoskeletal: {Hx; Musculoskeletal:210120528} Work History: {Hx; Work history:210120514}  Allergies  Brenda Ruiz is allergic to losartan.  Laboratory Chemistry Profile   Renal Lab Results  Component Value Date   BUN 20 12/16/2022   CREATININE 1.06 12/16/2022   GFRAA >60 02/25/2018   GFRNONAA >60 12/16/2022   PROTEINUR NEGATIVE 08/18/2022     Electrolytes Lab Results  Component Value Date   NA 137 12/16/2022   K 3.8 12/16/2022   CL 105 12/16/2022   CALCIUM 9.7 12/16/2022   MG 1.9 08/18/2022     Hepatic Lab Results  Component Value Date   AST 25 12/16/2022   ALT 26 12/16/2022   ALBUMIN 5.2 (H) 12/16/2022   ALKPHOS 52 12/16/2022   LIPASE 92 (H) 08/18/2022     ID Lab Results  Component Value Date   HIV Non Reactive 08/18/2022   SARSCOV2NAA NEGATIVE 08/18/2022   MRSAPCR NEGATIVE 10/17/2016     Bone No results found for: "VD25OH", "VD125OH2TOT", "XW9604VW0", "JW1191YN8", "25OHVITD1", "25OHVITD2", "25OHVITD3", "TESTOFREE", "TESTOSTERONE"   Endocrine Lab Results  Component Value Date   GLUCOSE 194 (H) 12/16/2022   GLUCOSEU >=500 (A) 08/18/2022   HGBA1C 6.6 (H) 08/18/2022   TSH 0.580 10/16/2016     Neuropathy Lab Results  Component Value Date   HGBA1C 6.6 (H) 08/18/2022   HIV Non Reactive 08/18/2022     CNS No results found for: "COLORCSF", "APPEARCSF", "RBCCOUNTCSF", "WBCCSF", "POLYSCSF", "LYMPHSCSF", "EOSCSF", "PROTEINCSF", "GLUCCSF", "JCVIRUS", "CSFOLI", "IGGCSF", "LABACHR", "ACETBL"   Inflammation (CRP: Acute  ESR: Chronic) Lab Results  Component Value Date   CRP 1.5 (H) 08/18/2022   ESRSEDRATE 2 08/18/2022   LATICACIDVEN 1.4 07/29/2015     Rheumatology No results found for: "RF", "ANA", "LABURIC", "URICUR", "LYMEIGGIGMAB", "LYMEABIGMQN", "HLAB27"   Coagulation Lab Results  Component Value Date   INR 1.1 08/18/2022   LABPROT 13.6 08/18/2022   APTT 27 08/18/2022   PLT 366 12/16/2022     Cardiovascular Lab  Results  Component Value Date   BNP 30.0 12/14/2015   CKTOTAL 841 (H) 02/14/2015   CKMB < 0.5 (L) 09/12/2012   TROPONINI <0.03 02/25/2018   HGB 17.6 (H) 12/16/2022   HCT 51.3 12/16/2022     Screening Lab Results  Component Value Date   SARSCOV2NAA NEGATIVE 08/18/2022   MRSAPCR NEGATIVE 10/17/2016   HIV Non Reactive 08/18/2022     Cancer No results found for: "CEA", "CA125", "LABCA2"   Allergens No results found for: "ALMOND", "APPLE", "ASPARAGUS", "AVOCADO", "BANANA", "BARLEY", "BASIL", "BAYLEAF", "GREENBEAN", "LIMABEAN", "WHITEBEAN", "BEEFIGE", "REDBEET", "BLUEBERRY", "BROCCOLI", "CABBAGE", "MELON", "CARROT", "CASEIN", "CASHEWNUT", "CAULIFLOWER", "CELERY"     Note: Lab results reviewed.  PFSH  Drug: Brenda Ruiz  reports no history of drug use. Alcohol:  reports that he does not currently use alcohol after a past usage of about 12.0 standard drinks of alcohol per week. Tobacco:  reports that he quit smoking about 7 months ago. His smoking use included cigarettes.  He started smoking about 20 years ago. He has a 20 pack-year smoking history. He has never used smokeless tobacco. Medical:  has a past medical history of Acute pancreatitis (07/28/2015), CAD (coronary artery disease), Chronic back pain, Diastolic dysfunction, Family history of diabetes mellitus, Family history of heart attack, Family history of stomach cancer, Gout, Hypertension, Hypertriglyceridemia, Remote Alcohol abuse, Tobacco abuse, and Type 2 diabetes mellitus (HCC) (07/28/2015). Family: family history includes Diabetes in his father, sister, and sister; Heart attack (age of onset: 17 - 18) in his father; Stomach cancer in his father. He was adopted.  Past Surgical History:  Procedure Laterality Date   CORONARY STENT INTERVENTION N/A 08/18/2022   Procedure: CORONARY STENT INTERVENTION;  Surgeon: Iran Ouch, MD;  Location: [facility] INVASIVE CV LAB;  Service: Cardiovascular;  Laterality: N/A;   LEFT HEART CATH AND  CORONARY ANGIOGRAPHY N/A 08/18/2022   Procedure: LEFT HEART CATH AND CORONARY ANGIOGRAPHY;  Surgeon: Iran Ouch, MD;  Location: [facility] INVASIVE CV LAB;  Service: Cardiovascular;  Laterality: N/A;   NO PAST SURGERIES     Active Ambulatory Problems    Diagnosis Date Noted   ARF (acute renal failure) (HCC) 02/14/2015   Leukocytosis 02/14/2015   Pancreatitis, acute 07/29/2015   Insulin dependent diabetes mellitus 06/27/2015   Alcohol abuse    Acute on chronic pancreatitis (HCC) 10/16/2016   Hypertriglyceridemia 02/23/2011   Atypical chest pain 10/21/2016   Liver lesion 10/21/2016   Polysubstance abuse (HCC) 10/21/2016   Left-sided chest pain 08/18/2022   Uncontrolled type 2 diabetes mellitus with hyperglycemia, with long-term current use of insulin (HCC) 08/18/2022   Elevated lipase 08/18/2022   Hypotension 08/18/2022   Family history of premature CAD 08/18/2022   History of substance use disorder 08/18/2022   Alcohol use disorder 08/18/2022   Tobacco use disorder 03/12/2016   Hypokalemia 08/18/2022   Non-ST elevation (NSTEMI) myocardial infarction (HCC) 08/18/2022   Essential hypertension 02/23/2011   Suicidal ideation 12/16/2022   Chronic pain syndrome 04/18/2023   Pharmacologic therapy 04/18/2023   Disorder of skeletal system 04/18/2023   Problems influencing health status 04/18/2023   Type 2 diabetes mellitus treated with insulin (HCC) 03/30/2016   Abnormal liver enzymes 04/07/2017   Anesthesia of skin 08/20/2016   Arthritis 04/18/2023   Chronic fatigue 12/10/2016   Depression 10/24/2014   Diabetic polyneuropathy associated with type 2 diabetes mellitus (HCC) 12/10/2016   Difficulty sleeping 12/10/2016   GERD (gastroesophageal reflux disease) 02/23/2011   Gout 02/23/2011   MRSA (methicillin resistant staphylococcus aureus) pneumonia (HCC) 03/30/2016   MRSA bacteremia 03/30/2016   Neck pain 10/21/2012   Chronic low back pain 02/23/2011   Tobacco abuse 02/23/2011    AKI (acute kidney injury) (HCC) 03/30/2016   Glucose intolerance (impaired glucose tolerance) 02/23/2011   Chronic pancreatitis due to chronic alcoholism (HCC) 04/18/2023   Elevated C-reactive protein (CRP) 04/18/2023   Resolved Ambulatory Problems    Diagnosis Date Noted   No Resolved Ambulatory Problems   Past Medical History:  Diagnosis Date   Acute pancreatitis 07/28/2015   CAD (coronary artery disease)    Chronic back pain    Diastolic dysfunction    Family history of diabetes mellitus    Family history of heart attack    Family history of stomach cancer    Hypertension    Type 2 diabetes mellitus (HCC) 07/28/2015   Constitutional Exam  General appearance: Well nourished, well developed, and well hydrated. In no apparent acute distress There were no vitals filed for  this visit. BMI Assessment: Estimated body mass index is 26.35 kg/m as calculated from the following:   Height as of 01/14/23: 6\' 2"  (1.88 m).   Weight as of 01/14/23: 205 lb 4 oz (93.1 kg).  BMI interpretation table: BMI level Category Range association with higher incidence of chronic pain  <18 kg/m2 Underweight   18.5-24.9 kg/m2 Ideal body weight   25-29.9 kg/m2 Overweight Increased incidence by 20%  30-34.9 kg/m2 Obese (Class I) Increased incidence by 68%  35-39.9 kg/m2 Severe obesity (Class II) Increased incidence by 136%  >40 kg/m2 Extreme obesity (Class III) Increased incidence by 254%   Patient's current BMI Ideal Body weight  There is no height or weight on file to calculate BMI. Patient weight not recorded   BMI Readings from Last 4 Encounters:  01/14/23 26.35 kg/m  11/25/22 26.50 kg/m  10/14/22 26.12 kg/m  09/08/22 26.04 kg/m   Wt Readings from Last 4 Encounters:  01/14/23 205 lb 4 oz (93.1 kg)  11/25/22 203 lb 9.6 oz (92.4 kg)  10/14/22 203 lb 6.4 oz (92.3 kg)  09/08/22 200 lb 1.6 oz (90.8 kg)    Psych/Mental status: Alert, oriented x 3 (person, place, & time)       Eyes:  PERLA Respiratory: No evidence of acute respiratory distress  Assessment  Primary Diagnosis & Pertinent Problem List: The primary encounter diagnosis was Chronic pain syndrome. Diagnoses of Pharmacologic therapy, Disorder of skeletal system, Problems influencing health status, Chronic pancreatitis due to chronic alcoholism (HCC), and Elevated C-reactive protein (CRP) were also pertinent to this visit.  Visit Diagnosis (New problems to examiner): 1. Chronic pain syndrome   2. Pharmacologic therapy   3. Disorder of skeletal system   4. Problems influencing health status   5. Chronic pancreatitis due to chronic alcoholism (HCC)   6. Elevated C-reactive protein (CRP)    Plan of Care (Initial workup plan)  Note: Brenda Ruiz was reminded that as per protocol, today's visit has been an evaluation only. We have not taken over the patient's controlled substance management.  Problem-specific plan: No problem-specific Assessment & Plan notes found for this encounter.  Lab Orders  No laboratory test(s) ordered today   Imaging Orders  No imaging studies ordered today   Referral Orders  No referral(s) requested today   Procedure Orders    No procedure(s) ordered today   Pharmacotherapy (current): Medications ordered:  No orders of the defined types were placed in this encounter.  Medications administered during this visit: Vladimir Creeks "Homero Fellers" had no medications administered during this visit.   Analgesic Pharmacotherapy:  Opioid Analgesics: For patients currently taking or requesting to take opioid analgesics, in accordance with [facility] Guidelines, we will assess their risks and indications for the use of these substances. After completing our evaluation, we may offer recommendations, but we no longer take patients for medication management. The prescribing physician will ultimately decide, based on his/her training and level of comfort whether to adopt any of  the recommendations, including whether or not to prescribe such medicines.  Membrane stabilizer: To be determined at a later time  Muscle relaxant: To be determined at a later time  NSAID: To be determined at a later time  Other analgesic(s): To be determined at a later time   Interventional management options: Brenda Ruiz was informed that there is no guarantee that he would be a candidate for interventional therapies. The decision will be based on the results of diagnostic studies, as well as  Brenda Ruiz's risk profile.  Procedure(s) under consideration:  Pending results of ordered studies      Interventional Therapies  Risk Factors  Considerations  Medical Comorbidities:     Planned  Pending:      Under consideration:   Pending   Completed:   None at this time   Therapeutic  Palliative (PRN) options:   None established   Completed by other providers:   None reported       Provider-requested follow-up: No follow-ups on file.  Future Appointments  Date Time Provider Department Center  04/19/2023  2:00 PM Delano Metz, MD [facility] None  07/16/2023  8:00 AM Iran Ouch, MD [facility] None    Duration of encounter: *** minutes.  Total time on encounter, as per AMA guidelines included both the face-to-face and non-face-to-face time personally spent by the physician and/or other qualified health care professional(s) on the day of the encounter (includes time in activities that require the physician or other qualified health care professional and does not include time in activities normally performed by clinical staff). Physician's time may include the following activities when performed: Preparing to see the patient (e.g., pre-charting review of records, searching for previously ordered imaging, lab work, and nerve conduction tests) Review of prior analgesic pharmacotherapies. Reviewing PMP Interpreting ordered tests (e.g., lab work, imaging, nerve conduction  tests) Performing post-procedure evaluations, including interpretation of diagnostic procedures Obtaining and/or reviewing separately obtained history Performing a medically appropriate examination and/or evaluation Counseling and educating the patient/family/caregiver Ordering medications, tests, or procedures Referring and communicating with other health care professionals (when not separately reported) Documenting clinical information in the electronic or other health record Independently interpreting results (not separately reported) and communicating results to the patient/ family/caregiver Care coordination (not separately reported)  Note by: Oswaldo Done, MD (TTS technology used. I apologize for any typographical errors that were not detected and corrected.) Date: 04/19/2023; Time: 1:30 PM

## 2023-04-18 NOTE — Patient Instructions (Signed)

## 2023-04-19 ENCOUNTER — Ambulatory Visit (HOSPITAL_BASED_OUTPATIENT_CLINIC_OR_DEPARTMENT_OTHER): Payer: 59 | Admitting: Pain Medicine

## 2023-04-19 ENCOUNTER — Other Ambulatory Visit: Payer: Self-pay

## 2023-04-19 DIAGNOSIS — Z789 Other specified health status: Secondary | ICD-10-CM

## 2023-04-19 DIAGNOSIS — Z79899 Other long term (current) drug therapy: Secondary | ICD-10-CM

## 2023-04-19 DIAGNOSIS — Z91199 Patient's noncompliance with other medical treatment and regimen due to unspecified reason: Secondary | ICD-10-CM

## 2023-04-19 DIAGNOSIS — M899 Disorder of bone, unspecified: Secondary | ICD-10-CM

## 2023-04-19 DIAGNOSIS — G8929 Other chronic pain: Secondary | ICD-10-CM

## 2023-04-19 DIAGNOSIS — G894 Chronic pain syndrome: Secondary | ICD-10-CM

## 2023-04-22 ENCOUNTER — Other Ambulatory Visit: Payer: Self-pay

## 2023-04-22 ENCOUNTER — Other Ambulatory Visit (HOSPITAL_COMMUNITY): Payer: Self-pay

## 2023-04-22 MED ORDER — EZETIMIBE 10 MG PO TABS
10.0000 mg | ORAL_TABLET | Freq: Every day | ORAL | 5 refills | Status: AC
  Filled 2023-04-22: qty 30, 30d supply, fill #0
  Filled 2023-05-18: qty 30, 30d supply, fill #1

## 2023-04-30 DIAGNOSIS — G4733 Obstructive sleep apnea (adult) (pediatric): Secondary | ICD-10-CM | POA: Diagnosis not present

## 2023-05-02 ENCOUNTER — Encounter: Payer: Self-pay | Admitting: Obstetrics & Gynecology

## 2023-05-03 ENCOUNTER — Other Ambulatory Visit (HOSPITAL_COMMUNITY): Payer: Self-pay

## 2023-05-03 ENCOUNTER — Other Ambulatory Visit: Payer: Self-pay

## 2023-05-03 DIAGNOSIS — Z01419 Encounter for gynecological examination (general) (routine) without abnormal findings: Secondary | ICD-10-CM

## 2023-05-03 MED ORDER — NORETHINDRONE ACET-ETHINYL EST 1.5-30 MG-MCG PO TABS
1.0000 | ORAL_TABLET | Freq: Every day | ORAL | 11 refills | Status: DC
Start: 2023-05-03 — End: 2024-01-26
  Filled 2023-05-03: qty 84, 84d supply, fill #0
  Filled 2023-07-27: qty 84, 84d supply, fill #1
  Filled 2023-10-17: qty 84, 84d supply, fill #2

## 2023-05-04 ENCOUNTER — Encounter (HOSPITAL_COMMUNITY): Payer: Self-pay

## 2023-05-05 ENCOUNTER — Other Ambulatory Visit (HOSPITAL_COMMUNITY): Payer: Self-pay

## 2023-05-06 ENCOUNTER — Other Ambulatory Visit (HOSPITAL_BASED_OUTPATIENT_CLINIC_OR_DEPARTMENT_OTHER): Payer: Self-pay

## 2023-05-06 MED ORDER — INFLUENZA VIRUS VACC SPLIT PF (FLUZONE) 0.5 ML IM SUSY
0.5000 mL | PREFILLED_SYRINGE | Freq: Once | INTRAMUSCULAR | 0 refills | Status: AC
  Filled 2023-05-06: qty 0.5, 1d supply, fill #0

## 2023-05-10 ENCOUNTER — Other Ambulatory Visit: Payer: Self-pay

## 2023-05-10 ENCOUNTER — Other Ambulatory Visit (HOSPITAL_COMMUNITY): Payer: Self-pay

## 2023-05-10 MED ORDER — GABAPENTIN 400 MG PO CAPS
400.0000 mg | ORAL_CAPSULE | Freq: Two times a day (BID) | ORAL | 2 refills | Status: DC
  Filled 2023-05-10: qty 60, 30d supply, fill #0
  Filled 2023-06-11: qty 60, 30d supply, fill #1
  Filled 2023-07-13: qty 60, 30d supply, fill #2

## 2023-05-11 ENCOUNTER — Other Ambulatory Visit (HOSPITAL_COMMUNITY): Payer: Self-pay

## 2023-05-11 NOTE — Progress Notes (Unsigned)
Patient: Brenda Ruiz  Service Category: E/M  Provider: Oswaldo Done, MD  DOB: 02/16/1976  DOS: 05/12/2023  Referring Provider: Joaquin Music, NP  MRN: [id number]  Setting: Ambulatory outpatient  PCP: Joaquin Music, NP  Type: New Patient  Specialty: Interventional Pain Management    Location: Office  Delivery: Face-to-face     Primary Reason(s) for Visit: Encounter for initial evaluation of one or more chronic problems (new to examiner) potentially causing chronic pain, and posing a threat to normal musculoskeletal function. (Level of risk: High) CC: No chief complaint on file.  HPI  Brenda Ruiz is a 47 y.o. year old, female patient, who comes for the first time to our practice referred by Joaquin Music, NP for our initial evaluation of her chronic pain. She has Vitamin D deficiency; Panic disorder (episodic paroxysmal anxiety); Other vitamin B12 deficiency anemias; Morbid obesity due to excess calories (HCC); Mixed irritable bowel syndrome; Mixed hyperlipidemia; IC (interstitial cystitis); GERD without esophagitis; Endometriosis; Cough; Chronic pain syndrome; Chronic otitis media; BMI 45.0-49.9, adult (HCC); Bilateral primary osteoarthritis of knee; Anxiety disorder, unspecified; Solitary right kidney; Trigeminal neuralgia; Hypothyroid; Bladder spasms; History of partial nephrectomy; Increased frequency of urination; Sleep disorder breathing; Urge incontinence; Urinary urgency; Pharmacologic therapy; Disorder of skeletal system; and Problems influencing health status on their problem list. Today she comes in for evaluation of her No chief complaint on file.  Pain Assessment: Location:     Radiating:   Onset:   Duration:   Quality:   Severity:  /10 (subjective, self-reported pain score)  Effect on ADL:   Timing:   Modifying factors:   BP:    HR:    Onset and Duration: {Hx; Onset and Duration:210120511} Cause of pain: {Hx; Cause:210120521} Severity: {Pain  Severity:210120502} Timing: {Symptoms; Timing:210120501} Aggravating Factors: {Causes; Aggravating pain factors:210120507} Alleviating Factors: {Causes; Alleviating Factors:210120500} Associated Problems: {Hx; Associated problems:210120515} Quality of Pain: {Hx; Symptom quality or Descriptor:210120531} Previous Examinations or Tests: {Hx; Previous examinations or test:210120529} Previous Treatments: {Hx; Previous Treatment:210120503}  Brenda Ruiz is being evaluated for possible interventional pain management therapies for the treatment of her chronic pain.   ***  Brenda Ruiz has been informed that this initial visit was an evaluation only.  On the follow up appointment I will go over the results, including ordered tests and available interventional therapies. At that time she will have the opportunity to decide whether to proceed with offered therapies or not. In the event that Brenda Ruiz prefers avoiding interventional options, this will conclude our involvement in the case.  Medication management recommendations may be provided upon request.  Patient informed that diagnostic tests may be ordered to assist in identifying underlying causes, narrow the list of differential diagnoses and aid in determining candidacy for (or contraindications to) planned therapeutic interventions.  Historic Controlled Substance Pharmacotherapy Review  PMP and historical list of controlled substances: ***  Most recently prescribed opioid analgesics:   *** MME/day: *** mg/day  Historical Monitoring: The patient  reports that she does not currently use drugs. List of prior UDS Testing: No results found for: "MDMA", "COCAINSCRNUR", "PCPSCRNUR", "PCPQUANT", "CANNABQUANT", "THCU", "ETH", "CBDTHCR", "D8THCCBX", "D9THCCBX" Historical Background Evaluation: [place] PMP: PDMP reviewed during this encounter. Review of the past 60-months conducted.             PMP NARX Score Report:  Narcotic: *** Sedative: *** Stimulant:  *** [place] Department of public safety, offender search: Engineer, mining Information) Non-contributory Risk Assessment Profile: Aberrant behavior: None observed or detected today Risk factors for fatal  opioid overdose: None identified today PMP NARX Overdose Risk Score: *** Fatal overdose hazard ratio (HR): Calculation deferred Non-fatal overdose hazard ratio (HR): Calculation deferred Risk of opioid abuse or dependence: 0.7-3.0% with doses <= 36 MME/day and 6.1-26% with doses >= 120 MME/day. Substance use disorder (SUD) risk level: See below Personal History of Substance Abuse (SUD-Substance use disorder):  Alcohol:    Illegal Drugs:    Rx Drugs:    ORT Risk Level calculation:    ORT Scoring interpretation table:  Score <3 = Low Risk for SUD  Score between 4-7 = Moderate Risk for SUD  Score >8 = High Risk for Opioid Abuse   PHQ-2 Depression Scale:  Total score:    PHQ-2 Scoring interpretation table: (Score and probability of major depressive disorder)  Score 0 = No depression  Score 1 = 15.4% Probability  Score 2 = 21.1% Probability  Score 3 = 38.4% Probability  Score 4 = 45.5% Probability  Score 5 = 56.4% Probability  Score 6 = 78.6% Probability   PHQ-9 Depression Scale:  Total score:    PHQ-9 Scoring interpretation table:  Score 0-4 = No depression  Score 5-9 = Mild depression  Score 10-14 = Moderate depression  Score 15-19 = Moderately severe depression  Score 20-27 = Severe depression (2.4 times higher risk of SUD and 2.89 times higher risk of overuse)   Pharmacologic Plan: As per protocol, I have not taken over any controlled substance management, pending the results of ordered tests and/or consults.            Initial impression: Pending review of available data and ordered tests.  Meds   Current Outpatient Medications:    ALPRAZolam (XANAX) 0.25 MG tablet, 3 (three)  times daily as needed., Disp: , Rfl:    ALPRAZolam (XANAX) 0.5 MG tablet, Take by mouth., Disp: , Rfl:     baclofen (LIORESAL) 20 MG tablet, Take 1 tablet (20 mg total) by mouth every 8 (eight) hours with food or milk., Disp: 270 tablet, Rfl: 1   BACLOFEN PO, , Disp: , Rfl:    Biotin 1000 MCG tablet, 1 tablet Orally Once a day, Disp: , Rfl:    Blood Pressure Monitoring (OMRON 3 SERIES BP MONITOR) DEVI, Use as directed, Disp: 1 each, Rfl: 0   cetirizine (ZYRTEC ALLERGY) 10 MG tablet, 1 tablet Orally Once a day, Disp: , Rfl:    cetirizine (ZYRTEC) 10 MG tablet, Take 10 mg by mouth daily., Disp: , Rfl:    Cholecalciferol (D 1000) 25 MCG (1000 UT) capsule, Take by mouth., Disp: , Rfl:    Cholecalciferol (VITAMIN D3 PO), Take 2,000 Units by mouth daily., Disp: , Rfl:    cloNIDine (CATAPRES) 0.1 MG tablet, 1 tablet Orally Once a day as needed for BP over 160/100, Disp: , Rfl:    cyanocobalamin (,VITAMIN B-12,) 1000 MCG/ML injection, Inject 1,000 mcg into the muscle every 30 (thirty) days. , Disp: , Rfl:    Cyanocobalamin (PHYSICIANS EZ USE B-12) 1000 MCG/ML KIT, Inject 1,000 mcg as directed every twenty-eight (28) days., Disp: , Rfl:    cyanocobalamin (VITAMIN B12) 1000 MCG/ML injection, 1 ml Injection once a week, Disp: , Rfl:    cyclobenzaprine (FLEXERIL) 10 MG tablet, Take 1 tablet (10 mg total) by mouth at bedtime as needed., Disp: 30 tablet, Rfl: 2   diazepam (VALIUM) 10 MG tablet, Insert ONE suppository into VAGINA nightly as needed FOR pelvic floor muscle relaxation., Disp: , Rfl:    diazepam (VALIUM)  5 MG/ML solution, Take by mouth every 8 (eight) hours as needed for anxiety., Disp: , Rfl:    diclofenac Sodium (VOLTAREN) 1 % GEL, Apply topically up to four times a day as needed for pain., Disp: 100 g, Rfl: 2   dicyclomine (BENTYL) 10 MG capsule, Take 10 mg by mouth 4 (four) times daily as needed.  (Patient not taking: Reported on 03/25/2023), Disp: , Rfl:    dicyclomine (BENTYL) 10 MG capsule, Take by mouth., Disp: , Rfl:    dicyclomine (BENTYL) 20 MG tablet, Take 1 tablet (20 mg total) by mouth 3  (three) times daily., Disp: 90 tablet, Rfl: 5   ezetimibe (ZETIA) 10 MG tablet, TAKE 1 TABLET BY MOUTH ONCE A DAY, Disp: 30 tablet, Rfl: 2   ezetimibe (ZETIA) 10 MG tablet, TAKE 1 TABLET BY MOUTH ONCE A DAY, Disp: 30 tablet, Rfl: 2   ezetimibe (ZETIA) 10 MG tablet, Take 1 tablet (10 mg total) by mouth daily., Disp: 30 tablet, Rfl: 5   fluticasone (FLONASE) 50 MCG/ACT nasal spray, Place 1 spray into both nostrils daily., Disp: 16 g, Rfl: 5   fluticasone (FLONASE) 50 MCG/ACT nasal spray, 1 spray in each nostril Nasally Once a day for 30 days, Disp: , Rfl:    gabapentin (NEURONTIN) 300 MG capsule, Take by mouth., Disp: , Rfl:    gabapentin (NEURONTIN) 400 MG capsule, TAKE 1 CAPSULE BY MOUTH TWO TIMES DAILY (NOON AND NIGHT AS DIRECTED), Disp: 60 capsule, Rfl: 2   gabapentin (NEURONTIN) 400 MG capsule, Take 1 capsule (400 mg total) by mouth 2 (two) times daily (at noon and at night)., Disp: 60 capsule, Rfl: 2   gabapentin (NEURONTIN) 600 MG tablet, TAKE 1 TABLET BY MOUTH EVERY MORNING, Disp: 30 tablet, Rfl: 2   gabapentin (NEURONTIN) 600 MG tablet, TAKE 1 TABLET BY MOUTH EVERY MORNING, Disp: 60 tablet, Rfl: 0   gabapentin (NEURONTIN) 600 MG tablet, TAKE 1 TABLET BY MOUTH EVERY MORNING, Disp: 30 tablet, Rfl: 2   gabapentin (NEURONTIN) 600 MG tablet, Take 1 tablet (600 mg total) by mouth every morning., Disp: 30 tablet, Rfl: 5   hydrochlorothiazide (HYDRODIURIL) 12.5 MG tablet, Take 1 tablet (12.5 mg total) by mouth in the morning., Disp: 30 tablet, Rfl: 5   HYDROcodone-acetaminophen (NORCO/VICODIN) 5-325 MG tablet, Take by mouth., Disp: , Rfl:    hydrOXYzine (ATARAX) 25 MG tablet, Take by mouth., Disp: , Rfl:    hydrOXYzine (VISTARIL) 25 MG capsule, Take 1 capsule (25 mg total) by mouth every 12 (twelve) hours., Disp: 60 capsule, Rfl: 2   hydrOXYzine (VISTARIL) 25 MG capsule, , Disp: , Rfl:    ketoconazole (NIZORAL) 2 % cream, 1 application Externally Once a day for 14 day(s), Disp: , Rfl:     levonorgestrel-ethinyl estradiol (SEASONALE) 0.15-0.03 MG tablet, Take 1 tablet by mouth daily., Disp: , Rfl:    levonorgestrel-ethinyl estradiol (SEASONALE) 0.15-0.03 MG tablet, Take 1 tablet by mouth daily., Disp: , Rfl:    levonorgestrel-ethinyl estradiol (SEASONALE) 0.15-0.03 MG tablet, Take 1 tablet by mouth daily., Disp: , Rfl:    levothyroxine (SYNTHROID) 175 MCG tablet, Take by mouth., Disp: , Rfl:    levothyroxine (SYNTHROID) 200 MCG tablet, Take 1 tablet (200 mcg total) by mouth in the morning on an empty stomach., Disp: 30 tablet, Rfl: 5   levothyroxine (SYNTHROID) 200 MCG tablet, Take 1 tablet (200 mcg) by mouth in the morning on an empty stomach (with to equal ), Disp: 30 tablet, Rfl: 1   levothyroxine (  SYNTHROID) 200 MCG tablet, 1 tablet with to equal in the morning on an empty stomach Orally Once a day for 30 days, Disp: , Rfl:    levothyroxine (SYNTHROID) 25 MCG tablet, Take 1 tablet (along with 200 mcg tablet for total) by mouth in the morning on an empty stomach, Disp: 30 tablet, Rfl: 5   levothyroxine (SYNTHROID) 25 MCG tablet, Take 1 tablet (200 mcg with 25 mcg total ) by mouth in the morning on an empty stomach., Disp: 30 tablet, Rfl: 1   magnesium oxide (MAG-OX) 400 MG tablet, Take 400 mg by mouth daily., Disp: , Rfl:    magnesium oxide (MAG-OX) 400 MG tablet, Take by mouth., Disp: , Rfl:    Meth-Hyo-M Bl-Na Phos-Ph Sal (URIBEL) 118 MG CAPS, Take 1 capsule (118 mg total) by mouth 4 (four) times daily as needed., Disp: 120 capsule, Rfl: 99   Meth-Hyo-M Bl-Na Phos-Ph Sal (URIBEL) 118 MG CAPS, 1 capsule Orally Four times a day for 10 day(s), Disp: , Rfl:    MISCELLANEOUS VAGINAL PRODUCTS VA, Valium (Diazepam) 10 mg vaginal suppository #30. Insert 1 suppository into vagina nightly as needed for pelvic floor muscle relaxation, Disp: , Rfl:    montelukast (SINGULAIR) 10 MG tablet, Take 1 tablet (10 mg) by mouth daily., Disp: 30 tablet, Rfl: 2    montelukast (SINGULAIR) 10 MG tablet, Take 1 tablet by mouth daily., Disp: , Rfl:    Norethindrone Acetate-Ethinyl Estradiol (LOESTRIN) 1.5-30 MG-MCG tablet, Take 1 tablet by mouth daily.  Take continuously, Disp: 21 tablet, Rfl: 11   Omega-3 Fatty Acids (SALMON OIL-1000 PO), Take 1 capsule by mouth daily., Disp: , Rfl:    omeprazole (PRILOSEC) 40 MG capsule, Take 1 capsule (40 mg total) by mouth every morning  30 minutes before breakfast., Disp: 30 capsule, Rfl: 3   ondansetron (ZOFRAN) 4 MG tablet, Take 1 tablet (4 mg total) by mouth every 8 (eight) hours as needed for nausea., Disp: 30 tablet, Rfl: 1   ondansetron (ZOFRAN) 4 MG tablet, Take 1 tablet by mouth every 8 (eight) hours as needed., Disp: , Rfl:    oxybutynin (DITROPAN XL) 15 MG 24 hr tablet, Take 1 tablet (15 mg total) by mouth daily., Disp: 90 tablet, Rfl: 3   oxybutynin (DITROPAN XL) 15 MG 24 hr tablet, Take 1 tablet by mouth daily., Disp: , Rfl:    oxybutynin (DITROPAN-XL) 10 MG 24 hr tablet, 1 tablet Orally Once a day for 30 day(s), Disp: , Rfl:    phenazopyridine (PYRIDIUM) 100 MG tablet, Take by mouth., Disp: , Rfl:    Potassium 99 MG TABS, Take by mouth., Disp: , Rfl:    pregabalin (LYRICA) 75 MG capsule, Take by mouth., Disp: , Rfl:    promethazine (PHENERGAN) 25 MG tablet, Take by mouth., Disp: , Rfl:    raNITIdine HCl (RANITIDINE 150 MAX STRENGTH PO), Take by mouth., Disp: , Rfl:    sertraline (ZOLOFT) 100 MG tablet, Take 2 tablets (200 mg total) by mouth daily., Disp: 180 tablet, Rfl: 1   Specialty Vitamins Products (ELON MATRIX 5000) TABS, Take by mouth., Disp: , Rfl:    Syringe/Needle, Disp, (SYRINGE 3CC/27GX1-1/4") 27G X 1-1/4" 3 ML MISC, as directed b12 shots once week Once a week, Disp: , Rfl:    traMADol (ULTRAM) 50 MG tablet, Take 1 tablet (50 mg total) by mouth 2 (two) times a day as needed for moderate pain (4-6) for up to 5 days., Disp: 10 tablet, Rfl: 0  traMADol (ULTRAM) 50 MG tablet, 1 tablet as needed Orally  every 6 hours for 5 days, Disp: , Rfl:    Ubrogepant (UBRELVY) 100 MG TABS, Take 1 tablet (100 mg total) by mouth as needed. May repeat in two hours if symptoms persist as needed, Disp: 16 tablet, Rfl: 2   Vitamin D, Ergocalciferol, (DRISDOL) 1.25 MG (50000 UNIT) CAPS capsule, Take 1 capsule by mouth once a week., Disp: 4 capsule, Rfl: 11   Vitamin D, Ergocalciferol, (DRISDOL) 1.25 MG (50000 UNIT) CAPS capsule, 1 capsule Orally weekly for 28 days, Disp: , Rfl:   Imaging Review  Cervical Imaging: Cervical MR wo contrast: No results found for this or any previous visit.  Cervical MR wo contrast: No valid procedures specified. Cervical MR w/wo contrast: No results found for this or any previous visit.  Cervical MR w contrast: No results found for this or any previous visit.  Cervical CT wo contrast: No results found for this or any previous visit.  Cervical CT w/wo contrast: No results found for this or any previous visit.  Cervical CT w/wo contrast: No results found for this or any previous visit.  Cervical CT w contrast: No results found for this or any previous visit.  Cervical CT outside: No results found for this or any previous visit.  Cervical DG 1 view: No results found for this or any previous visit.  Cervical DG 2-3 views: No results found for this or any previous visit.  Cervical DG F/E views: No results found for this or any previous visit.  Cervical DG 2-3 clearing views: No results found for this or any previous visit.  Cervical DG Bending/F/E views: No results found for this or any previous visit.  Cervical DG complete: No results found for this or any previous visit.  Cervical DG Myelogram views: No results found for this or any previous visit.  Cervical DG Myelogram views: No results found for this or any previous visit.  Cervical Discogram views: No results found for this or any previous visit.   Shoulder Imaging: Shoulder-R MR w contrast: No results found  for this or any previous visit.  Shoulder-L MR w contrast: No results found for this or any previous visit.  Shoulder-R MR w/wo contrast: No results found for this or any previous visit.  Shoulder-L MR w/wo contrast: No results found for this or any previous visit.  Shoulder-R MR wo contrast: No results found for this or any previous visit.  Shoulder-L MR wo contrast: No results found for this or any previous visit.  Shoulder-R CT w contrast: No results found for this or any previous visit.  Shoulder-L CT w contrast: No results found for this or any previous visit.  Shoulder-R CT w/wo contrast: No results found for this or any previous visit.  Shoulder-L CT w/wo contrast: No results found for this or any previous visit.  Shoulder-R CT wo contrast: No results found for this or any previous visit.  Shoulder-L CT wo contrast: No results found for this or any previous visit.  Shoulder-R DG Arthrogram: No results found for this or any previous visit.  Shoulder-L DG Arthrogram: No results found for this or any previous visit.  Shoulder-R DG 1 view: No results found for this or any previous visit.  Shoulder-L DG 1 view: No results found for this or any previous visit.  Shoulder-R DG: No results found for this or any previous visit.  Shoulder-L DG: No results found for this or any previous visit.  Thoracic Imaging: Thoracic MR wo contrast: No results found for this or any previous visit.  Thoracic MR wo contrast: No valid procedures specified. Thoracic MR w/wo contrast: No results found for this or any previous visit.  Thoracic MR w contrast: No results found for this or any previous visit.  Thoracic CT wo contrast: No results found for this or any previous visit.  Thoracic CT w/wo contrast: No results found for this or any previous visit.  Thoracic CT w/wo contrast: No results found for this or any previous visit.  Thoracic CT w contrast: No results found for this or any  previous visit.  Thoracic DG 2-3 views: No results found for this or any previous visit.  Thoracic DG 4 views: No results found for this or any previous visit.  Thoracic DG: No results found for this or any previous visit.  Thoracic DG w/swimmers view: No results found for this or any previous visit.  Thoracic DG Myelogram views: No results found for this or any previous visit.  Thoracic DG Myelogram views: No results found for this or any previous visit.   Lumbosacral Imaging: Lumbar MR wo contrast: No results found for this or any previous visit.  Lumbar MR wo contrast: No valid procedures specified. Lumbar MR w/wo contrast: No results found for this or any previous visit.  Lumbar MR w/wo contrast: No results found for this or any previous visit.  Lumbar MR w contrast: No results found for this or any previous visit.  Lumbar CT wo contrast: No results found for this or any previous visit.  Lumbar CT w/wo contrast: No results found for this or any previous visit.  Lumbar CT w/wo contrast: No results found for this or any previous visit.  Lumbar CT w contrast: No results found for this or any previous visit.  Lumbar DG 1V: No results found for this or any previous visit.  Lumbar DG 1V (Clearing): No results found for this or any previous visit.  Lumbar DG 2-3V (Clearing): No results found for this or any previous visit.  Lumbar DG 2-3 views: No results found for this or any previous visit.  Lumbar DG (Complete) 4+V: No results found for this or any previous visit.        Lumbar DG F/E views: No results found for this or any previous visit.        Lumbar DG Bending views: No results found for this or any previous visit.        Lumbar DG Myelogram views: No results found for this or any previous visit.  Lumbar DG Myelogram: No results found for this or any previous visit.  Lumbar DG Myelogram: No results found for this or any previous visit.  Lumbar DG Myelogram: No  results found for this or any previous visit.  Lumbar DG Myelogram Lumbosacral: No results found for this or any previous visit.  Lumbar DG Diskogram views: No results found for this or any previous visit.  Lumbar DG Diskogram views: No results found for this or any previous visit.  Lumbar DG Epidurogram OP: No results found for this or any previous visit.  Lumbar DG Epidurogram IP: No valid procedures specified.  Sacroiliac Joint Imaging: Sacroiliac Joint DG: No results found for this or any previous visit.  Sacroiliac Joint MR w/wo contrast: No results found for this or any previous visit.  Sacroiliac Joint MR wo contrast: No results found for this or any previous visit.   Spine Imaging: Whole Spine DG  Myelogram views: No results found for this or any previous visit.  Whole Spine MR Mets screen: No results found for this or any previous visit.  Whole Spine MR Mets screen: No results found for this or any previous visit.  Whole Spine MR w/wo: No results found for this or any previous visit.  MRA Spinal Canal w/ cm: No results found for this or any previous visit.  MRA Spinal Canal wo/ cm: No valid procedures specified. MRA Spinal Canal w/wo cm: No results found for this or any previous visit.  Spine Outside MR Films: No results found for this or any previous visit.  Spine Outside CT Films: No results found for this or any previous visit.  CT-Guided Biopsy: No results found for this or any previous visit.  CT-Guided Needle Placement: No results found for this or any previous visit.  DG Spine outside: No results found for this or any previous visit.  IR Spine outside: No results found for this or any previous visit.  NM Spine outside: No results found for this or any previous visit.   Hip Imaging: Hip-R MR w contrast: No results found for this or any previous visit.  Hip-L MR w contrast: No results found for this or any previous visit.  Hip-R MR w/wo contrast: No  results found for this or any previous visit.  Hip-L MR w/wo contrast: No results found for this or any previous visit.  Hip-R MR wo contrast: No results found for this or any previous visit.  Hip-L MR wo contrast: No results found for this or any previous visit.  Hip-R CT w contrast: No results found for this or any previous visit.  Hip-L CT w contrast: No results found for this or any previous visit.  Hip-R CT w/wo contrast: No results found for this or any previous visit.  Hip-L CT w/wo contrast: No results found for this or any previous visit.  Hip-R CT wo contrast: No results found for this or any previous visit.  Hip-L CT wo contrast: No results found for this or any previous visit.  Hip-R DG 2-3 views: No results found for this or any previous visit.  Hip-L DG 2-3 views: No results found for this or any previous visit.  Hip-R DG Arthrogram: No results found for this or any previous visit.  Hip-L DG Arthrogram: No results found for this or any previous visit.  Hip-B DG Bilateral: No results found for this or any previous visit.  Hip-B DG Bilateral (5V): No results found for this or any previous visit.   Knee Imaging: Knee-R MR w contrast: No results found for this or any previous visit.  Knee-L MR w/o contrast: No results found for this or any previous visit.  Knee-R MR w/wo contrast: No results found for this or any previous visit.  Knee-L MR w/wo contrast: No results found for this or any previous visit.  Knee-R MR wo contrast: No results found for this or any previous visit.  Knee-L MR wo contrast: No results found for this or any previous visit.  Knee-R CT w contrast: No results found for this or any previous visit.  Knee-L CT w contrast: No results found for this or any previous visit.  Knee-R CT w/wo contrast: No results found for this or any previous visit.  Knee-L CT w/wo contrast: No results found for this or any previous visit.  Knee-R CT wo  contrast: No results found for this or any previous visit.  Knee-L CT  wo contrast: No results found for this or any previous visit.  Knee-R DG 1-2 views: No results found for this or any previous visit.  Knee-L DG 1-2 views: No results found for this or any previous visit.  Knee-R DG 3 views: No results found for this or any previous visit.  Knee-L DG 3 views: No results found for this or any previous visit.  Knee-R DG 4 views: No results found for this or any previous visit.  Knee-L DG 4 views: No results found for this or any previous visit.  Knee-R DG Arthrogram: No results found for this or any previous visit.  Knee-L DG Arthrogram: No results found for this or any previous visit.   Ankle Imaging: Ankle-R DG Complete: No results found for this or any previous visit.  Ankle-L DG Complete: No results found for this or any previous visit.   Foot Imaging: Foot-R DG Complete: No results found for this or any previous visit.  Foot-L DG Complete: No results found for this or any previous visit.   Elbow Imaging: Elbow-R DG Complete: No results found for this or any previous visit.  Elbow-L DG Complete: No results found for this or any previous visit.   Wrist Imaging: Wrist-R DG Complete: No results found for this or any previous visit.  Wrist-L DG Complete: No results found for this or any previous visit.   Hand Imaging: Hand-R DG Complete: No results found for this or any previous visit.  Hand-L DG Complete: No results found for this or any previous visit.   Complexity Note: Imaging results reviewed.                         ROS  Cardiovascular: {Hx; Cardiovascular History:210120525} Pulmonary or Respiratory: {Hx; Pumonary and/or Respiratory History:210120523} Neurological: {Hx; Neurological:210120504} Psychological-Psychiatric: {Hx; Psychological-Psychiatric History:210120512} Gastrointestinal: {Hx; Gastrointestinal:210120527} Genitourinary: {Hx;  Genitourinary:210120506} Hematological: {Hx; Hematological:210120510} Endocrine: {Hx; Endocrine history:210120509} Rheumatologic: {Hx; Rheumatological:210120530} Musculoskeletal: {Hx; Musculoskeletal:210120528} Work History: {Hx; Work history:210120514}  Allergies  Brenda Ruiz has no allergies on file.  Laboratory Chemistry Profile   Renal No results found for: "BUN", "CREATININE", "LABCREA", "BCR", "GFR", "GFRAA", "GFRNONAA", "SPECGRAV", "PHUR", "PROTEINUR"   Electrolytes No results found for: "NA", "K", "CL", "CALCIUM", "MG", "PHOS"   Hepatic No results found for: "AST", "ALT", "ALBUMIN", "ALKPHOS", "AMYLASE", "LIPASE", "AMMONIA"   ID No results found for: "LYMEIGGIGMAB", "HIV", "SARSCOV2NAA", "STAPHAUREUS", "MRSAPCR", "HCVAB", "PREGTESTUR", "RMSFIGG", "QFVRPH1IGG", "QFVRPH2IGG"   Bone No results found for: "VD25OH", "VD125OH2TOT", "ZO1096EA5", "WU9811BJ4", "25OHVITD1", "25OHVITD2", "25OHVITD3", "TESTOFREE", "TESTOSTERONE"   Endocrine No results found for: "GLUCOSE", "GLUCOSEU", "HGBA1C", "TSH", "FREET4", "TESTOFREE", "TESTOSTERONE", "SHBG", "ESTRADIOL", "ESTRADIOLPCT", "ESTRADIOLFRE", "LABPREG", "ACTH", "CRTSLPL", "UCORFRPERLTR", "UCORFRPERDAY", "CORTISOLBASE"   Neuropathy No results found for: "VITAMINB12", "FOLATE", "HGBA1C", "HIV"   CNS No results found for: "COLORCSF", "APPEARCSF", "RBCCOUNTCSF", "WBCCSF", "POLYSCSF", "LYMPHSCSF", "EOSCSF", "PROTEINCSF", "GLUCCSF", "JCVIRUS", "CSFOLI", "IGGCSF", "LABACHR", "ACETBL"   Inflammation (CRP: Acute  ESR: Chronic) No results found for: "CRP", "ESRSEDRATE", "LATICACIDVEN"   Rheumatology No results found for: "RF", "ANA", "LABURIC", "URICUR", "LYMEIGGIGMAB", "LYMEABIGMQN", "HLAB27"   Coagulation No results found for: "INR", "LABPROT", "APTT", "PLT", "DDIMER", "LABHEMA", "VITAMINK1", "AT3"   Cardiovascular No results found for: "BNP", "CKTOTAL", "CKMB", "TROPONINI", "HGB", "HCT", "LABVMA", "EPIRU", "EPINEPH24HUR", "NOREPRU",  "NOREPI24HUR", "DOPARU", "DOPAM24HRUR"   Screening No results found for: "SARSCOV2NAA", "COVIDSOURCE", "STAPHAUREUS", "MRSAPCR", "HCVAB", "HIV", "PREGTESTUR"   Cancer No results found for: "CEA", "CA125", "LABCA2"   Allergens No results found for: "ALMOND", "APPLE", "ASPARAGUS", "AVOCADO", "BANANA", "BARLEY", "BASIL", "BAYLEAF", "GREENBEAN", "LIMABEAN", "WHITEBEAN", "BEEFIGE", "REDBEET", "BLUEBERRY", "  BROCCOLI", "CABBAGE", "MELON", "CARROT", "CASEIN", "CASHEWNUT", "CAULIFLOWER", "CELERY"     Note: Lab results reviewed.  PFSH  Drug: Brenda Ruiz  reports that she does not currently use drugs. Alcohol:  reports current alcohol use. Tobacco:  reports that she has never smoked. She has never been exposed to tobacco smoke. She has never used smokeless tobacco. Medical:  has a past medical history of Acute upper respiratory infection, unspecified, Anxiety, Anxiety disorder, unspecified, Arthritis, Bilateral primary osteoarthritis of knee, Blood transfusion without reported diagnosis (1988), BMI 45.0-49.9, adult (HCC), Chronic otitis media, Chronic pain syndrome, Cough, Depression, Endometriosis, Fever, unspecified, Fibromyalgia, GERD without esophagitis, Hypothyroid, IBS (irritable bowel syndrome), Interstitial cystitis (chronic) without hematuria, Mixed hyperlipidemia, Mixed irritable bowel syndrome, Morbid obesity due to excess calories (HCC), Other vitamin B12 deficiency anemias, Panic disorder (episodic paroxysmal anxiety), Solitary right kidney, TMJ (temporomandibular joint disorder), Trigeminal neuralgia, Trigeminal neuralgia, and Vitamin D deficiency. Family: family history includes Diabetes type II in her maternal grandfather; Hyperlipidemia in her mother; Hypertension in her maternal grandfather and mother; Stroke in her maternal grandmother.  Past Surgical History:  Procedure Laterality Date   bladder stent  1999   carpel tunnel releae Right Mardh 2020   NEPHRECTOMY Left 1989   tubes in  ears  06/2017   Active Ambulatory Problems    Diagnosis Date Noted   Vitamin D deficiency    Panic disorder (episodic paroxysmal anxiety)    Other vitamin B12 deficiency anemias    Morbid obesity due to excess calories (HCC)    Mixed irritable bowel syndrome    Mixed hyperlipidemia    IC (interstitial cystitis) 03/22/2018   GERD without esophagitis    Endometriosis 04/18/2023   Cough    Chronic pain syndrome    Chronic otitis media    BMI 45.0-49.9, adult (HCC)    Bilateral primary osteoarthritis of knee 07/14/2022   Anxiety disorder, unspecified    Solitary right kidney    Trigeminal neuralgia    Hypothyroid    Bladder spasms 07/14/2022   History of partial nephrectomy 07/14/2022   Increased frequency of urination 07/14/2022   Sleep disorder breathing 01/11/2023   Urge incontinence 07/14/2022   Urinary urgency 07/14/2022   Pharmacologic therapy 04/18/2023   Disorder of skeletal system 04/18/2023   Problems influencing health status 04/18/2023   Resolved Ambulatory Problems    Diagnosis Date Noted   No Resolved Ambulatory Problems   Past Medical History:  Diagnosis Date   Acute upper respiratory infection, unspecified    Anxiety    Arthritis    Blood transfusion without reported diagnosis 1988   Depression    Fever, unspecified    Fibromyalgia    IBS (irritable bowel syndrome)    Interstitial cystitis (chronic) without hematuria    TMJ (temporomandibular joint disorder)    Constitutional Exam  General appearance: Well nourished, well developed, and well hydrated. In no apparent acute distress There were no vitals filed for this visit. BMI Assessment: Estimated body mass index is 46.27 kg/m as calculated from the following:   Height as of 03/25/23: 5\' 6"  (1.676 m).   Weight as of 03/25/23: 286 lb 11.2 oz (130 kg).  BMI interpretation table: BMI level Category Range association with higher incidence of chronic pain  <18 kg/m2 Underweight   18.5-24.9 kg/m2 Ideal  body weight   25-29.9 kg/m2 Overweight Increased incidence by 20%  30-34.9 kg/m2 Obese (Class I) Increased incidence by 68%  35-39.9 kg/m2 Severe obesity (Class II) Increased incidence by 136%  >40  kg/m2 Extreme obesity (Class III) Increased incidence by 254%   Patient's current BMI Ideal Body weight  There is no height or weight on file to calculate BMI. Patient weight not recorded   BMI Readings from Last 4 Encounters:  03/25/23 46.27 kg/m  03/31/19 46.81 kg/m  03/28/19 46.87 kg/m   Wt Readings from Last 4 Encounters:  03/25/23 286 lb 11.2 oz (130 kg)  03/31/19 290 lb (131.5 kg)  03/28/19 290 lb 6 oz (131.7 kg)    Psych/Mental status: Alert, oriented x 3 (person, place, & time)       Eyes: PERLA Respiratory: No evidence of acute respiratory distress  Assessment  Primary Diagnosis & Pertinent Problem List: {There were no encounter diagnoses. (Refresh or delete this SmartLink)}  Visit Diagnosis (New problems to examiner): No diagnosis found. Plan of Care (Initial workup plan)  Note: Brenda Ruiz was reminded that as per protocol, today's visit has been an evaluation only. We have not taken over the patient's controlled substance management.  Problem-specific plan: No problem-specific Assessment & Plan notes found for this encounter.  Lab Orders  No laboratory test(s) ordered today   Imaging Orders  No imaging studies ordered today   Referral Orders  No referral(s) requested today   Procedure Orders    No procedure(s) ordered today   Pharmacotherapy (current): Medications ordered:  No orders of the defined types were placed in this encounter.  Medications administered during this visit: Brenda Ruiz had no medications administered during this visit.   Analgesic Pharmacotherapy:  Opioid Analgesics: For patients currently taking or requesting to take opioid analgesics, in accordance with [facility] Guidelines, we will assess their risks and  indications for the use of these substances. After completing our evaluation, we may offer recommendations, but we no longer take patients for medication management. The prescribing physician will ultimately decide, based on his/her training and level of comfort whether to adopt any of the recommendations, including whether or not to prescribe such medicines.  Membrane stabilizer: To be determined at a later time  Muscle relaxant: To be determined at a later time  NSAID: To be determined at a later time  Other analgesic(s): To be determined at a later time   Interventional management options: Brenda Ruiz was informed that there is no guarantee that she would be a candidate for interventional therapies. The decision will be based on the results of diagnostic studies, as well as Brenda Ruiz's risk profile.  Procedure(s) under consideration:  Pending results of ordered studies      Interventional Therapies  Risk Factors  Considerations  Medical Comorbidities:     Planned  Pending:      Under consideration:   Pending   Completed:   None at this time   Therapeutic  Palliative (PRN) options:   None established   Completed by other providers:   None reported       Provider-requested follow-up: No follow-ups on file.  Future Appointments  Date Time Provider Department Center  05/12/2023 10:00 AM Delano Metz, MD [facility] None    Duration of encounter: *** minutes.  Total time on encounter, as per AMA guidelines included both the face-to-face and non-face-to-face time personally spent by the physician and/or other qualified health care professional(s) on the day of the encounter (includes time in activities that require the physician or other qualified health care professional and does not include time in activities normally performed by clinical staff). Physician's time may include the following activities  when performed: Preparing to see the patient (e.g., pre-charting  review of records, searching for previously ordered imaging, lab work, and nerve conduction tests) Review of prior analgesic pharmacotherapies. Reviewing PMP Interpreting ordered tests (e.g., lab work, imaging, nerve conduction tests) Performing post-procedure evaluations, including interpretation of diagnostic procedures Obtaining and/or reviewing separately obtained history Performing a medically appropriate examination and/or evaluation Counseling and educating the patient/family/caregiver Ordering medications, tests, or procedures Referring and communicating with other health care professionals (when not separately reported) Documenting clinical information in the electronic or other health record Independently interpreting results (not separately reported) and communicating results to the patient/ family/caregiver Care coordination (not separately reported)  Note by: Oswaldo Done, MD (TTS technology used. I apologize for any typographical errors that were not detected and corrected.) Date: 05/12/2023; Time: 10:50 AM

## 2023-05-11 NOTE — Patient Instructions (Signed)

## 2023-05-12 ENCOUNTER — Ambulatory Visit (HOSPITAL_BASED_OUTPATIENT_CLINIC_OR_DEPARTMENT_OTHER): Payer: 59 | Admitting: Pain Medicine

## 2023-05-12 ENCOUNTER — Encounter: Payer: Self-pay | Admitting: Pain Medicine

## 2023-05-12 DIAGNOSIS — G8929 Other chronic pain: Secondary | ICD-10-CM

## 2023-05-12 DIAGNOSIS — Z91199 Patient's noncompliance with other medical treatment and regimen due to unspecified reason: Secondary | ICD-10-CM

## 2023-05-18 ENCOUNTER — Other Ambulatory Visit (HOSPITAL_COMMUNITY): Payer: Self-pay

## 2023-05-18 ENCOUNTER — Other Ambulatory Visit: Payer: Self-pay

## 2023-05-18 MED ORDER — CYCLOBENZAPRINE HCL 10 MG PO TABS
10.0000 mg | ORAL_TABLET | Freq: Every evening | ORAL | 2 refills | Status: DC | PRN
  Filled 2023-05-18: qty 30, 30d supply, fill #0
  Filled 2023-06-30: qty 30, 30d supply, fill #1
  Filled 2023-09-04: qty 30, 30d supply, fill #2

## 2023-05-18 MED ORDER — HYDROCHLOROTHIAZIDE 12.5 MG PO TABS
12.5000 mg | ORAL_TABLET | Freq: Every morning | ORAL | 5 refills | Status: DC
  Filled 2023-05-18: qty 30, 30d supply, fill #0
  Filled 2023-06-22: qty 30, 30d supply, fill #1
  Filled 2023-07-27: qty 30, 30d supply, fill #2
  Filled 2023-08-23: qty 30, 30d supply, fill #3
  Filled 2023-10-01: qty 30, 30d supply, fill #4
  Filled 2023-10-29: qty 30, 30d supply, fill #5

## 2023-05-24 ENCOUNTER — Ambulatory Visit: Payer: 59 | Admitting: Orthopaedic Surgery

## 2023-05-31 DIAGNOSIS — G4733 Obstructive sleep apnea (adult) (pediatric): Secondary | ICD-10-CM | POA: Diagnosis not present

## 2023-06-11 ENCOUNTER — Other Ambulatory Visit (HOSPITAL_COMMUNITY): Payer: Self-pay

## 2023-06-11 ENCOUNTER — Other Ambulatory Visit: Payer: Self-pay

## 2023-06-15 ENCOUNTER — Other Ambulatory Visit (HOSPITAL_COMMUNITY): Payer: Self-pay

## 2023-06-16 ENCOUNTER — Other Ambulatory Visit (HOSPITAL_COMMUNITY): Payer: Self-pay

## 2023-06-22 ENCOUNTER — Other Ambulatory Visit (HOSPITAL_COMMUNITY): Payer: Self-pay

## 2023-06-22 ENCOUNTER — Other Ambulatory Visit: Payer: Self-pay

## 2023-06-23 ENCOUNTER — Other Ambulatory Visit (HOSPITAL_COMMUNITY): Payer: Self-pay

## 2023-06-23 MED ORDER — LEVOTHYROXINE SODIUM 200 MCG PO TABS
200.0000 ug | ORAL_TABLET | Freq: Every morning | ORAL | 2 refills | Status: DC
  Filled 2023-06-23 – 2023-10-29 (×2): qty 30, 30d supply, fill #0
  Filled 2023-12-07: qty 30, 30d supply, fill #1
  Filled 2024-01-06: qty 30, 30d supply, fill #2

## 2023-06-30 ENCOUNTER — Other Ambulatory Visit: Payer: Self-pay

## 2023-06-30 DIAGNOSIS — G4733 Obstructive sleep apnea (adult) (pediatric): Secondary | ICD-10-CM | POA: Diagnosis not present

## 2023-07-13 ENCOUNTER — Other Ambulatory Visit (HOSPITAL_COMMUNITY): Payer: Self-pay

## 2023-07-13 ENCOUNTER — Other Ambulatory Visit: Payer: Self-pay

## 2023-07-13 MED ORDER — DICLOFENAC SODIUM 1 % EX GEL
1.0000 | Freq: Four times a day (QID) | CUTANEOUS | 2 refills | Status: AC | PRN
Start: 2023-07-13 — End: ?
  Filled 2023-07-13: qty 100, 25d supply, fill #0
  Filled 2024-07-10: qty 100, 25d supply, fill #1

## 2023-07-14 ENCOUNTER — Other Ambulatory Visit (HOSPITAL_COMMUNITY): Payer: Self-pay

## 2023-07-14 MED ORDER — OMEPRAZOLE 40 MG PO CPDR
DELAYED_RELEASE_CAPSULE | ORAL | 3 refills | Status: DC
  Filled 2023-07-14: qty 30, 30d supply, fill #0
  Filled 2023-08-23: qty 30, 30d supply, fill #1
  Filled 2023-10-17: qty 30, 30d supply, fill #2
  Filled 2023-11-21: qty 30, 30d supply, fill #3

## 2023-07-14 MED ORDER — HYDROXYZINE PAMOATE 25 MG PO CAPS
ORAL_CAPSULE | ORAL | 2 refills | Status: DC
  Filled 2023-07-14: qty 60, 30d supply, fill #0
  Filled 2023-08-23: qty 60, 30d supply, fill #1
  Filled 2023-10-01: qty 60, 30d supply, fill #2

## 2023-07-16 ENCOUNTER — Other Ambulatory Visit (HOSPITAL_COMMUNITY): Payer: Self-pay

## 2023-07-16 ENCOUNTER — Other Ambulatory Visit: Payer: Self-pay

## 2023-07-16 MED ORDER — BACLOFEN 20 MG PO TABS
20.0000 mg | ORAL_TABLET | Freq: Three times a day (TID) | ORAL | 5 refills | Status: DC | PRN
  Filled 2023-07-16: qty 90, 30d supply, fill #0
  Filled 2023-10-06: qty 90, 30d supply, fill #1
  Filled 2023-11-21: qty 90, 30d supply, fill #2
  Filled 2023-12-29: qty 90, 30d supply, fill #3
  Filled 2024-02-21: qty 90, 30d supply, fill #4
  Filled 2024-03-17: qty 90, 30d supply, fill #5

## 2023-07-27 ENCOUNTER — Other Ambulatory Visit (HOSPITAL_COMMUNITY): Payer: Self-pay

## 2023-07-27 ENCOUNTER — Other Ambulatory Visit: Payer: Self-pay

## 2023-07-27 ENCOUNTER — Other Ambulatory Visit (HOSPITAL_BASED_OUTPATIENT_CLINIC_OR_DEPARTMENT_OTHER): Payer: Self-pay

## 2023-07-27 DIAGNOSIS — J302 Other seasonal allergic rhinitis: Secondary | ICD-10-CM | POA: Diagnosis not present

## 2023-07-27 DIAGNOSIS — E038 Other specified hypothyroidism: Secondary | ICD-10-CM | POA: Diagnosis not present

## 2023-07-27 DIAGNOSIS — D518 Other vitamin B12 deficiency anemias: Secondary | ICD-10-CM | POA: Diagnosis not present

## 2023-07-27 DIAGNOSIS — E559 Vitamin D deficiency, unspecified: Secondary | ICD-10-CM | POA: Diagnosis not present

## 2023-07-27 DIAGNOSIS — E119 Type 2 diabetes mellitus without complications: Secondary | ICD-10-CM | POA: Diagnosis not present

## 2023-07-27 DIAGNOSIS — I10 Essential (primary) hypertension: Secondary | ICD-10-CM | POA: Diagnosis not present

## 2023-07-27 DIAGNOSIS — E782 Mixed hyperlipidemia: Secondary | ICD-10-CM | POA: Diagnosis not present

## 2023-07-27 MED ORDER — MONTELUKAST SODIUM 10 MG PO TABS
10.0000 mg | ORAL_TABLET | Freq: Every day | ORAL | 2 refills | Status: DC
  Filled 2023-07-27: qty 30, 30d supply, fill #0
  Filled 2023-09-04: qty 30, 30d supply, fill #1
  Filled 2023-10-01: qty 30, 30d supply, fill #2

## 2023-08-23 ENCOUNTER — Other Ambulatory Visit: Payer: Self-pay

## 2023-08-23 ENCOUNTER — Other Ambulatory Visit (HOSPITAL_COMMUNITY): Payer: Self-pay

## 2023-08-23 MED ORDER — GABAPENTIN 600 MG PO TABS
600.0000 mg | ORAL_TABLET | Freq: Every morning | ORAL | 5 refills | Status: DC
  Filled 2023-08-23: qty 30, 30d supply, fill #0
  Filled 2023-10-17: qty 30, 30d supply, fill #1
  Filled 2023-11-21: qty 30, 30d supply, fill #2
  Filled 2023-12-29: qty 30, 30d supply, fill #3
  Filled 2024-01-24: qty 30, 30d supply, fill #4
  Filled 2024-06-04: qty 30, 30d supply, fill #5

## 2023-08-23 MED ORDER — GABAPENTIN 400 MG PO CAPS
400.0000 mg | ORAL_CAPSULE | Freq: Two times a day (BID) | ORAL | 2 refills | Status: DC
  Filled 2023-08-23: qty 60, 30d supply, fill #0
  Filled 2023-10-17: qty 60, 30d supply, fill #1
  Filled 2023-11-21: qty 60, 30d supply, fill #2

## 2023-08-28 HISTORY — PX: APPENDECTOMY: SHX54

## 2023-09-04 ENCOUNTER — Other Ambulatory Visit (HOSPITAL_COMMUNITY): Payer: Self-pay

## 2023-09-05 ENCOUNTER — Other Ambulatory Visit: Payer: Self-pay

## 2023-09-06 ENCOUNTER — Other Ambulatory Visit (HOSPITAL_COMMUNITY): Payer: Self-pay

## 2023-09-06 ENCOUNTER — Other Ambulatory Visit: Payer: Self-pay

## 2023-09-06 MED ORDER — ONDANSETRON HCL 4 MG PO TABS
4.0000 mg | ORAL_TABLET | Freq: Three times a day (TID) | ORAL | 1 refills | Status: DC | PRN
  Filled 2023-09-06: qty 30, 10d supply, fill #0
  Filled 2023-10-11: qty 30, 10d supply, fill #1

## 2023-09-09 DIAGNOSIS — A084 Viral intestinal infection, unspecified: Secondary | ICD-10-CM | POA: Diagnosis not present

## 2023-09-20 DIAGNOSIS — R160 Hepatomegaly, not elsewhere classified: Secondary | ICD-10-CM | POA: Diagnosis not present

## 2023-09-20 DIAGNOSIS — D72829 Elevated white blood cell count, unspecified: Secondary | ICD-10-CM | POA: Diagnosis not present

## 2023-09-20 DIAGNOSIS — R112 Nausea with vomiting, unspecified: Secondary | ICD-10-CM | POA: Diagnosis not present

## 2023-09-20 DIAGNOSIS — K8020 Calculus of gallbladder without cholecystitis without obstruction: Secondary | ICD-10-CM | POA: Diagnosis not present

## 2023-09-20 DIAGNOSIS — G8911 Acute pain due to trauma: Secondary | ICD-10-CM | POA: Diagnosis not present

## 2023-09-20 DIAGNOSIS — R1013 Epigastric pain: Secondary | ICD-10-CM | POA: Diagnosis not present

## 2023-09-20 DIAGNOSIS — M6250 Muscle wasting and atrophy, not elsewhere classified, unspecified site: Secondary | ICD-10-CM | POA: Diagnosis not present

## 2023-09-20 DIAGNOSIS — N3010 Interstitial cystitis (chronic) without hematuria: Secondary | ICD-10-CM | POA: Diagnosis not present

## 2023-09-20 DIAGNOSIS — R109 Unspecified abdominal pain: Secondary | ICD-10-CM | POA: Diagnosis not present

## 2023-09-21 DIAGNOSIS — Z0389 Encounter for observation for other suspected diseases and conditions ruled out: Secondary | ICD-10-CM | POA: Diagnosis not present

## 2023-09-21 DIAGNOSIS — R109 Unspecified abdominal pain: Secondary | ICD-10-CM | POA: Diagnosis not present

## 2023-09-21 DIAGNOSIS — D72829 Elevated white blood cell count, unspecified: Secondary | ICD-10-CM | POA: Diagnosis not present

## 2023-09-21 DIAGNOSIS — R000 Tachycardia, unspecified: Secondary | ICD-10-CM | POA: Diagnosis not present

## 2023-09-21 DIAGNOSIS — K819 Cholecystitis, unspecified: Secondary | ICD-10-CM | POA: Diagnosis not present

## 2023-09-22 DIAGNOSIS — K219 Gastro-esophageal reflux disease without esophagitis: Secondary | ICD-10-CM | POA: Diagnosis not present

## 2023-09-22 DIAGNOSIS — E669 Obesity, unspecified: Secondary | ICD-10-CM | POA: Diagnosis not present

## 2023-09-22 DIAGNOSIS — K3533 Acute appendicitis with perforation and localized peritonitis, with abscess: Secondary | ICD-10-CM | POA: Diagnosis not present

## 2023-09-22 DIAGNOSIS — Z6841 Body Mass Index (BMI) 40.0 and over, adult: Secondary | ICD-10-CM | POA: Diagnosis not present

## 2023-09-22 DIAGNOSIS — G43909 Migraine, unspecified, not intractable, without status migrainosus: Secondary | ICD-10-CM | POA: Diagnosis not present

## 2023-09-22 DIAGNOSIS — E039 Hypothyroidism, unspecified: Secondary | ICD-10-CM | POA: Diagnosis not present

## 2023-09-22 DIAGNOSIS — I10 Essential (primary) hypertension: Secondary | ICD-10-CM | POA: Diagnosis not present

## 2023-09-22 DIAGNOSIS — K3532 Acute appendicitis with perforation and localized peritonitis, without abscess: Secondary | ICD-10-CM | POA: Diagnosis not present

## 2023-09-22 DIAGNOSIS — K582 Mixed irritable bowel syndrome: Secondary | ICD-10-CM | POA: Diagnosis not present

## 2023-09-22 DIAGNOSIS — N3010 Interstitial cystitis (chronic) without hematuria: Secondary | ICD-10-CM | POA: Diagnosis not present

## 2023-09-22 DIAGNOSIS — G500 Trigeminal neuralgia: Secondary | ICD-10-CM | POA: Diagnosis not present

## 2023-10-01 ENCOUNTER — Other Ambulatory Visit (HOSPITAL_COMMUNITY): Payer: Self-pay

## 2023-10-05 ENCOUNTER — Other Ambulatory Visit (HOSPITAL_COMMUNITY): Payer: Self-pay

## 2023-10-06 ENCOUNTER — Other Ambulatory Visit (HOSPITAL_COMMUNITY): Payer: Self-pay

## 2023-10-11 ENCOUNTER — Other Ambulatory Visit (HOSPITAL_COMMUNITY): Payer: Self-pay

## 2023-10-12 DIAGNOSIS — D72829 Elevated white blood cell count, unspecified: Secondary | ICD-10-CM | POA: Diagnosis not present

## 2023-10-14 DIAGNOSIS — R109 Unspecified abdominal pain: Secondary | ICD-10-CM | POA: Diagnosis not present

## 2023-10-18 ENCOUNTER — Other Ambulatory Visit (HOSPITAL_COMMUNITY): Payer: Self-pay

## 2023-10-18 ENCOUNTER — Other Ambulatory Visit: Payer: Self-pay

## 2023-10-19 ENCOUNTER — Other Ambulatory Visit: Payer: Self-pay

## 2023-10-26 DIAGNOSIS — G894 Chronic pain syndrome: Secondary | ICD-10-CM | POA: Diagnosis not present

## 2023-10-26 DIAGNOSIS — Z905 Acquired absence of kidney: Secondary | ICD-10-CM | POA: Diagnosis not present

## 2023-10-26 DIAGNOSIS — N3010 Interstitial cystitis (chronic) without hematuria: Secondary | ICD-10-CM | POA: Diagnosis not present

## 2023-10-26 DIAGNOSIS — N809 Endometriosis, unspecified: Secondary | ICD-10-CM | POA: Diagnosis not present

## 2023-10-26 DIAGNOSIS — K582 Mixed irritable bowel syndrome: Secondary | ICD-10-CM | POA: Diagnosis not present

## 2023-10-26 DIAGNOSIS — N3941 Urge incontinence: Secondary | ICD-10-CM | POA: Diagnosis not present

## 2023-10-29 ENCOUNTER — Other Ambulatory Visit: Payer: Self-pay

## 2023-10-29 ENCOUNTER — Other Ambulatory Visit (HOSPITAL_COMMUNITY): Payer: Self-pay

## 2023-11-01 ENCOUNTER — Other Ambulatory Visit: Payer: Self-pay

## 2023-11-01 ENCOUNTER — Other Ambulatory Visit (HOSPITAL_COMMUNITY): Payer: Self-pay

## 2023-11-01 MED ORDER — MONTELUKAST SODIUM 10 MG PO TABS
10.0000 mg | ORAL_TABLET | Freq: Every day | ORAL | 2 refills | Status: DC
  Filled 2023-11-01: qty 30, 30d supply, fill #0
  Filled 2023-12-07: qty 30, 30d supply, fill #1
  Filled 2024-01-06: qty 30, 30d supply, fill #2

## 2023-11-02 ENCOUNTER — Other Ambulatory Visit (HOSPITAL_COMMUNITY): Payer: Self-pay

## 2023-11-03 ENCOUNTER — Other Ambulatory Visit (HOSPITAL_COMMUNITY): Payer: Self-pay

## 2023-11-03 ENCOUNTER — Other Ambulatory Visit: Payer: Self-pay

## 2023-11-03 MED ORDER — HYDROXYZINE PAMOATE 25 MG PO CAPS
25.0000 mg | ORAL_CAPSULE | Freq: Two times a day (BID) | ORAL | 2 refills | Status: AC
  Filled 2023-11-03: qty 60, 30d supply, fill #0

## 2023-11-03 MED ORDER — CYCLOBENZAPRINE HCL 10 MG PO TABS
10.0000 mg | ORAL_TABLET | Freq: Every evening | ORAL | 2 refills | Status: DC | PRN
  Filled 2023-11-03: qty 30, 30d supply, fill #0
  Filled 2024-01-24: qty 30, 30d supply, fill #1
  Filled 2024-03-17: qty 30, 30d supply, fill #2

## 2023-11-09 ENCOUNTER — Other Ambulatory Visit (HOSPITAL_COMMUNITY): Payer: Self-pay

## 2023-11-09 ENCOUNTER — Other Ambulatory Visit: Payer: Self-pay

## 2023-11-09 MED ORDER — SERTRALINE HCL 25 MG PO TABS
25.0000 mg | ORAL_TABLET | Freq: Every day | ORAL | 0 refills | Status: AC
  Filled 2023-11-09: qty 30, 30d supply, fill #0

## 2023-11-17 ENCOUNTER — Encounter: Payer: Self-pay | Admitting: Obstetrics & Gynecology

## 2023-11-19 ENCOUNTER — Other Ambulatory Visit (HOSPITAL_COMMUNITY): Payer: Self-pay

## 2023-11-19 ENCOUNTER — Other Ambulatory Visit: Payer: Self-pay

## 2023-11-19 MED ORDER — HYDROXYZINE HCL 50 MG PO TABS
50.0000 mg | ORAL_TABLET | Freq: Three times a day (TID) | ORAL | 3 refills | Status: AC | PRN
  Filled 2023-11-19: qty 270, 90d supply, fill #0
  Filled 2024-03-17: qty 270, 90d supply, fill #1
  Filled 2024-07-10: qty 270, 90d supply, fill #2

## 2023-11-19 MED ORDER — MIRABEGRON ER 50 MG PO TB24
50.0000 mg | ORAL_TABLET | Freq: Every day | ORAL | 99 refills | Status: DC
  Filled 2023-11-19: qty 90, 90d supply, fill #0
  Filled 2023-12-07: qty 30, 30d supply, fill #0
  Filled 2024-01-06: qty 30, 30d supply, fill #1
  Filled 2024-02-01: qty 30, 30d supply, fill #2

## 2023-11-22 ENCOUNTER — Other Ambulatory Visit (HOSPITAL_COMMUNITY): Payer: Self-pay

## 2023-11-26 ENCOUNTER — Other Ambulatory Visit: Payer: Self-pay | Admitting: *Deleted

## 2023-11-26 ENCOUNTER — Other Ambulatory Visit (HOSPITAL_COMMUNITY): Payer: Self-pay

## 2023-11-26 DIAGNOSIS — N3010 Interstitial cystitis (chronic) without hematuria: Secondary | ICD-10-CM

## 2023-11-26 DIAGNOSIS — G894 Chronic pain syndrome: Secondary | ICD-10-CM

## 2023-12-02 ENCOUNTER — Other Ambulatory Visit (HOSPITAL_COMMUNITY): Payer: Self-pay

## 2023-12-03 ENCOUNTER — Other Ambulatory Visit (HOSPITAL_COMMUNITY): Payer: Self-pay

## 2023-12-06 ENCOUNTER — Other Ambulatory Visit (HOSPITAL_COMMUNITY): Payer: Self-pay

## 2023-12-07 ENCOUNTER — Other Ambulatory Visit (HOSPITAL_COMMUNITY): Payer: Self-pay

## 2023-12-08 ENCOUNTER — Other Ambulatory Visit (HOSPITAL_COMMUNITY): Payer: Self-pay

## 2023-12-08 ENCOUNTER — Other Ambulatory Visit: Payer: Self-pay

## 2023-12-08 MED ORDER — SERTRALINE HCL 50 MG PO TABS
50.0000 mg | ORAL_TABLET | Freq: Every day | ORAL | 2 refills | Status: AC
Start: 2023-12-08 — End: ?
  Filled 2023-12-08: qty 30, 30d supply, fill #0
  Filled 2024-01-06: qty 30, 30d supply, fill #1
  Filled 2024-02-01: qty 30, 30d supply, fill #2

## 2023-12-08 MED ORDER — FLUTICASONE PROPIONATE 50 MCG/ACT NA SUSP
1.0000 | Freq: Every day | NASAL | 5 refills | Status: DC
Start: 2023-12-08 — End: 2024-06-05
  Filled 2023-12-08: qty 16, 30d supply, fill #0
  Filled 2024-01-06: qty 16, 30d supply, fill #1
  Filled 2024-02-01: qty 16, 30d supply, fill #2
  Filled 2024-03-04: qty 16, 30d supply, fill #3
  Filled 2024-04-05: qty 16, 30d supply, fill #4
  Filled 2024-05-03: qty 16, 30d supply, fill #5

## 2023-12-08 MED ORDER — HYDROCHLOROTHIAZIDE 12.5 MG PO TABS
12.5000 mg | ORAL_TABLET | Freq: Every morning | ORAL | 5 refills | Status: DC
  Filled 2023-12-08: qty 30, 30d supply, fill #0
  Filled 2024-01-06: qty 30, 30d supply, fill #1
  Filled 2024-02-01: qty 30, 30d supply, fill #2
  Filled 2024-03-04: qty 30, 30d supply, fill #3
  Filled 2024-04-05: qty 30, 30d supply, fill #4
  Filled 2024-05-03: qty 30, 30d supply, fill #5

## 2023-12-29 ENCOUNTER — Other Ambulatory Visit: Payer: Self-pay

## 2023-12-29 ENCOUNTER — Other Ambulatory Visit (HOSPITAL_COMMUNITY): Payer: Self-pay

## 2023-12-29 MED ORDER — GABAPENTIN 400 MG PO CAPS
ORAL_CAPSULE | ORAL | 2 refills | Status: DC
  Filled 2023-12-29: qty 60, 30d supply, fill #0
  Filled 2024-01-24: qty 60, 30d supply, fill #1
  Filled 2024-06-04: qty 60, 30d supply, fill #2

## 2023-12-29 MED ORDER — OMEPRAZOLE 40 MG PO CPDR
40.0000 mg | DELAYED_RELEASE_CAPSULE | Freq: Every morning | ORAL | 3 refills | Status: DC
  Filled 2023-12-29: qty 30, 30d supply, fill #0
  Filled 2024-01-24: qty 30, 30d supply, fill #1
  Filled 2024-03-04: qty 30, 30d supply, fill #2
  Filled 2024-04-24: qty 30, 30d supply, fill #3

## 2024-01-06 ENCOUNTER — Other Ambulatory Visit: Payer: Self-pay

## 2024-01-06 ENCOUNTER — Other Ambulatory Visit (HOSPITAL_COMMUNITY): Payer: Self-pay

## 2024-01-06 ENCOUNTER — Other Ambulatory Visit: Payer: Self-pay | Admitting: Obstetrics & Gynecology

## 2024-01-06 DIAGNOSIS — Z01419 Encounter for gynecological examination (general) (routine) without abnormal findings: Secondary | ICD-10-CM

## 2024-01-11 DIAGNOSIS — M9902 Segmental and somatic dysfunction of thoracic region: Secondary | ICD-10-CM | POA: Diagnosis not present

## 2024-01-11 DIAGNOSIS — M461 Sacroiliitis, not elsewhere classified: Secondary | ICD-10-CM | POA: Diagnosis not present

## 2024-01-11 DIAGNOSIS — M9904 Segmental and somatic dysfunction of sacral region: Secondary | ICD-10-CM | POA: Diagnosis not present

## 2024-01-11 DIAGNOSIS — M546 Pain in thoracic spine: Secondary | ICD-10-CM | POA: Diagnosis not present

## 2024-01-11 DIAGNOSIS — M9901 Segmental and somatic dysfunction of cervical region: Secondary | ICD-10-CM | POA: Diagnosis not present

## 2024-01-11 DIAGNOSIS — G4486 Cervicogenic headache: Secondary | ICD-10-CM | POA: Diagnosis not present

## 2024-01-20 ENCOUNTER — Other Ambulatory Visit (HOSPITAL_COMMUNITY): Payer: Self-pay

## 2024-01-20 ENCOUNTER — Other Ambulatory Visit: Payer: Self-pay | Admitting: Obstetrics & Gynecology

## 2024-01-20 DIAGNOSIS — Z01419 Encounter for gynecological examination (general) (routine) without abnormal findings: Secondary | ICD-10-CM

## 2024-01-24 ENCOUNTER — Other Ambulatory Visit: Payer: Self-pay

## 2024-01-26 ENCOUNTER — Other Ambulatory Visit (HOSPITAL_BASED_OUTPATIENT_CLINIC_OR_DEPARTMENT_OTHER): Payer: Self-pay

## 2024-01-26 MED ORDER — NORETHINDRONE ACET-ETHINYL EST 1.5-30 MG-MCG PO TABS
1.0000 | ORAL_TABLET | Freq: Every day | ORAL | 11 refills | Status: AC
  Filled 2024-01-26 – 2024-01-31 (×2): qty 21, 21d supply, fill #0
  Filled 2024-02-17: qty 21, 21d supply, fill #1
  Filled 2024-03-09: qty 21, 21d supply, fill #2
  Filled 2024-04-05: qty 21, 21d supply, fill #3
  Filled 2024-04-24: qty 21, 21d supply, fill #4
  Filled 2024-05-29: qty 21, 21d supply, fill #5
  Filled 2024-06-23: qty 21, 21d supply, fill #6
  Filled 2024-07-10: qty 21, 21d supply, fill #7
  Filled 2024-07-30: qty 21, 21d supply, fill #8

## 2024-01-27 DIAGNOSIS — E038 Other specified hypothyroidism: Secondary | ICD-10-CM | POA: Diagnosis not present

## 2024-01-27 DIAGNOSIS — F411 Generalized anxiety disorder: Secondary | ICD-10-CM | POA: Diagnosis not present

## 2024-01-27 DIAGNOSIS — E782 Mixed hyperlipidemia: Secondary | ICD-10-CM | POA: Diagnosis not present

## 2024-01-27 DIAGNOSIS — M7918 Myalgia, other site: Secondary | ICD-10-CM | POA: Diagnosis not present

## 2024-01-31 ENCOUNTER — Other Ambulatory Visit (HOSPITAL_BASED_OUTPATIENT_CLINIC_OR_DEPARTMENT_OTHER): Payer: Self-pay

## 2024-01-31 ENCOUNTER — Other Ambulatory Visit (HOSPITAL_COMMUNITY): Payer: Self-pay

## 2024-01-31 ENCOUNTER — Other Ambulatory Visit: Payer: Self-pay

## 2024-01-31 MED ORDER — MONTELUKAST SODIUM 10 MG PO TABS
10.0000 mg | ORAL_TABLET | Freq: Every day | ORAL | 2 refills | Status: DC
  Filled 2024-01-31: qty 30, 30d supply, fill #0
  Filled 2024-04-05: qty 30, 30d supply, fill #1
  Filled 2024-05-29: qty 30, 30d supply, fill #2

## 2024-02-01 ENCOUNTER — Other Ambulatory Visit: Payer: Self-pay

## 2024-02-01 ENCOUNTER — Other Ambulatory Visit (HOSPITAL_COMMUNITY): Payer: Self-pay

## 2024-02-01 MED ORDER — SERTRALINE HCL 100 MG PO TABS
100.0000 mg | ORAL_TABLET | Freq: Every day | ORAL | 2 refills | Status: DC
  Filled 2024-02-01: qty 30, 30d supply, fill #0
  Filled 2024-03-04: qty 30, 30d supply, fill #1
  Filled 2024-04-05: qty 30, 30d supply, fill #2

## 2024-02-01 MED ORDER — LEVOTHYROXINE SODIUM 200 MCG PO TABS
200.0000 ug | ORAL_TABLET | Freq: Every morning | ORAL | 1 refills | Status: AC
  Filled 2024-02-01: qty 90, 90d supply, fill #0

## 2024-02-02 ENCOUNTER — Other Ambulatory Visit (HOSPITAL_BASED_OUTPATIENT_CLINIC_OR_DEPARTMENT_OTHER): Payer: Self-pay

## 2024-02-02 ENCOUNTER — Other Ambulatory Visit (HOSPITAL_COMMUNITY): Payer: Self-pay

## 2024-02-02 MED ORDER — LEVOTHYROXINE SODIUM 175 MCG PO TABS
175.0000 ug | ORAL_TABLET | Freq: Every day | ORAL | 1 refills | Status: DC
  Filled 2024-02-02 – 2024-02-03 (×2): qty 90, 90d supply, fill #0
  Filled 2024-05-03: qty 90, 90d supply, fill #1

## 2024-02-03 ENCOUNTER — Other Ambulatory Visit (HOSPITAL_COMMUNITY): Payer: Self-pay

## 2024-02-03 ENCOUNTER — Other Ambulatory Visit (HOSPITAL_BASED_OUTPATIENT_CLINIC_OR_DEPARTMENT_OTHER): Payer: Self-pay

## 2024-02-03 ENCOUNTER — Other Ambulatory Visit: Payer: Self-pay

## 2024-02-04 ENCOUNTER — Other Ambulatory Visit (HOSPITAL_COMMUNITY): Payer: Self-pay

## 2024-02-04 ENCOUNTER — Other Ambulatory Visit (HOSPITAL_BASED_OUTPATIENT_CLINIC_OR_DEPARTMENT_OTHER): Payer: Self-pay

## 2024-02-04 MED ORDER — TRAMADOL HCL 50 MG PO TABS
50.0000 mg | ORAL_TABLET | Freq: Four times a day (QID) | ORAL | 0 refills | Status: DC
  Filled 2024-02-04 (×2): qty 20, 5d supply, fill #0

## 2024-02-08 DIAGNOSIS — M25561 Pain in right knee: Secondary | ICD-10-CM | POA: Diagnosis not present

## 2024-02-08 DIAGNOSIS — N809 Endometriosis, unspecified: Secondary | ICD-10-CM | POA: Diagnosis not present

## 2024-02-08 DIAGNOSIS — N3010 Interstitial cystitis (chronic) without hematuria: Secondary | ICD-10-CM | POA: Diagnosis not present

## 2024-02-08 DIAGNOSIS — K582 Mixed irritable bowel syndrome: Secondary | ICD-10-CM | POA: Diagnosis not present

## 2024-02-08 DIAGNOSIS — M25562 Pain in left knee: Secondary | ICD-10-CM | POA: Diagnosis not present

## 2024-02-08 DIAGNOSIS — N3941 Urge incontinence: Secondary | ICD-10-CM | POA: Diagnosis not present

## 2024-02-08 DIAGNOSIS — B354 Tinea corporis: Secondary | ICD-10-CM | POA: Diagnosis not present

## 2024-02-11 ENCOUNTER — Ambulatory Visit: Admitting: Orthopedic Surgery

## 2024-02-11 ENCOUNTER — Encounter: Payer: Self-pay | Admitting: Orthopedic Surgery

## 2024-02-11 ENCOUNTER — Other Ambulatory Visit (INDEPENDENT_AMBULATORY_CARE_PROVIDER_SITE_OTHER): Payer: Self-pay

## 2024-02-11 DIAGNOSIS — M79604 Pain in right leg: Secondary | ICD-10-CM

## 2024-02-11 DIAGNOSIS — M79605 Pain in left leg: Secondary | ICD-10-CM | POA: Diagnosis not present

## 2024-02-11 NOTE — Progress Notes (Signed)
 Office Visit Note   Patient: Brenda Ruiz           Date of Birth: [birth year]           MRN: [id number] Visit Date: 02/11/2024 Requested by: Dorene Perkins, NP [address] [place],  [place] [address] PCP: Dorene Perkins, NP  Subjective: Chief Complaint  Patient presents with   Right Knee - Pain   Left Knee - Pain   Lower Back - Pain    HPI: Brenda Ruiz is a 48 y.o. female who presents to the office reporting orthopedic complaints today including bilateral knee pain which is chronic.  She denies any history of injury.  Pain is constant.  Does wake her from sleep at night.  She tries to get to the gym but after 1 day she is actually down for a week.  Does report some popping as well as deep pain which radiates into the knees and down her legs.  She also reports some right buttock pain with radicular symptoms and occasional numbness and tingling.  Denies any discrete low back pain but does have a radicular component.  Denies any groin pain.  She states she has been diagnosed with neuropathy in the past.  This affects both her upper and lower extremities.  Cannot really take anti-inflammatories because she only has 1 kidney.  Her musculoskeletal complaints interfere with ADLs.  She feels like her legs and knees are hot and on fire at times.  Tramadol  helps some.  She also has been on steroids in the past which have helped.  For her neuropathy she has been on gabapentin  and Lyrica.  Most recently she had a battery of labs which indicated slightly elevated CRP but normal sed rate and ANA negative but thyroid  antibody elevated.  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                ROS: All systems reviewed are negative as they relate to the chief complaint within the history of present illness.  Patient denies fevers or chills.  Assessment & Plan: Visit Diagnoses:  1. Bilateral leg pain     Plan: Impression is multiple orthopedic problems in the extremities with no  clear structural issue.  Her knee exam and radiographs are unremarkable.  I think she may have some radiculopathy giving her some of the steep pain.  Does have some degenerative disc disease at L5-S1 on her lumbar spine plain radiographs.  This could be a constellation of orthopedic symptoms related to some type of spondyloarthropathy.  Possibly seronegative rheumatoid arthritis.  MRI lumbar spine indicated to evaluate this primarily right-sided radiculopathy.  We can follow-up after that study for review.  I think it would be helpful for her to see a rheumatologist as well so they can correlate her abnormal lab findings with multiple joint pain.  Follow-Up Instructions: No follow-ups on file.   Orders:  Orders Placed This Encounter  Procedures   XR Lumbar Spine 2-3 Views   XR Knee 1-2 Views Left   XR Knee 1-2 Views Right   MR Lumbar Spine w/o contrast   Ambulatory referral to Rheumatology   No orders of the defined types were placed in this encounter.     Procedures: No procedures performed   Clinical Data: No additional findings.  Objective: Vital Signs: There were no vitals taken for this visit.  Physical Exam:  Constitutional: Patient appears well-developed HEENT:  Head: Normocephalic Eyes:EOM are normal Neck:  Normal range of motion Cardiovascular: Normal rate Pulmonary/chest: Effort normal Neurologic: Patient is alert Skin: Skin is warm Psychiatric: Patient has normal mood and affect  Ortho Exam: Ortho exam demonstrates normal gait alignment.  No effusion in either knee.  Both knees have full range of motion with stable collateral and cruciate ligaments.  No patellofemoral crepitus in either knee.  Pedal pulses palpable.  No groin pain with internal ex rotation of the leg.  No definite paresthesias L1 S1 bilaterally.  Mild pain with forward lateral bending.  No trochanteric tenderness is noted.  No skin changes or masses present in that low back region.  No definite  swelling in the ankle midfoot or metatarsal region in both feet.  Specialty Comments:  No specialty comments available.  Imaging: XR Lumbar Spine 2-3 Views Result Date: 02/11/2024 AP lateral radiographs lumbar spine reviewed.  Mild degenerative changes present at L5-S1 without spondylolisthesis.  Normal lordosis.  No acute compression fracture.  Mild facet arthritis present.    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   Increased frequency of urination 07/14/2022   Urge incontinence 07/14/2022   Urinary urgency 07/14/2022   Vitamin D  deficiency    Panic disorder (episodic paroxysmal anxiety)    Other vitamin B12 deficiency anemias    Morbid obesity due to excess calories (HCC)    Mixed irritable bowel syndrome    Mixed hyperlipidemia    GERD without esophagitis    Cough    Chronic pain syndrome    Chronic otitis media    BMI 45.0-49.9, adult (HCC)    Anxiety disorder, unspecified    Solitary right kidney    Trigeminal neuralgia    Hypothyroid    IC (interstitial cystitis) 03/22/2018   Past Medical History:  Diagnosis Date   Acute upper respiratory infection, unspecified    Anxiety    Anxiety disorder, unspecified    Arthritis    Bilateral primary osteoarthritis of knee    right    Blood transfusion without reported diagnosis 1988   Left nephrectomy   BMI 45.0-49.9, adult (HCC)    Chronic otitis media    tubes in ears   Chronic pain syndrome    Cough    Depression    Endometriosis    Fever, unspecified    Fibromyalgia    GERD without esophagitis    Hypothyroid    IBS (irritable bowel syndrome)    Interstitial cystitis  (chronic) without hematuria    Mixed hyperlipidemia    Mixed irritable bowel syndrome    Morbid obesity due to excess calories (HCC)    Other vitamin B12 deficiency anemias    Panic disorder (episodic paroxysmal anxiety)    Solitary right kidney    TMJ (temporomandibular joint disorder)    Trigeminal neuralgia    Trigeminal neuralgia    Vitamin D  deficiency     Family History  Problem Relation Age of Onset   Hypertension Mother    Hyperlipidemia Mother    Stroke Maternal Grandmother    Diabetes type II Maternal Grandfather    Hypertension Maternal Grandfather    Neuropathy Neg Hx    Colon cancer Neg Hx    Esophageal cancer Neg  Hx     Past Surgical History:  Procedure Laterality Date   bladder stent  1999   carpel tunnel releae Right Mardh 2020   NEPHRECTOMY Left 1989   tubes in ears  06/2017   Social History   Occupational History   Not on file  Tobacco Use   Smoking status: Never    Passive exposure: Never   Smokeless tobacco: Never  Vaping Use   Vaping status: Never Used  Substance and Sexual Activity   Alcohol use: Yes    Comment: ocassionally/1-2 times a week    Drug use: Not Currently   Sexual activity: Not on file

## 2024-02-17 ENCOUNTER — Other Ambulatory Visit (HOSPITAL_COMMUNITY): Payer: Self-pay

## 2024-02-17 ENCOUNTER — Other Ambulatory Visit: Payer: Self-pay

## 2024-02-17 MED ORDER — TRAMADOL HCL 50 MG PO TABS
50.0000 mg | ORAL_TABLET | Freq: Four times a day (QID) | ORAL | 0 refills | Status: DC
  Filled 2024-02-17: qty 20, 5d supply, fill #0

## 2024-02-21 ENCOUNTER — Other Ambulatory Visit (HOSPITAL_COMMUNITY): Payer: Self-pay

## 2024-02-21 ENCOUNTER — Other Ambulatory Visit: Payer: Self-pay

## 2024-02-21 ENCOUNTER — Ambulatory Visit: Admitting: Orthopedic Surgery

## 2024-02-26 ENCOUNTER — Ambulatory Visit
Admission: RE | Admit: 2024-02-26 | Discharge: 2024-02-26 | Disposition: A | Discharge status: Home / Self Care | Source: Ambulatory Visit | Attending: Orthopedic Surgery | Admitting: Orthopedic Surgery

## 2024-02-26 DIAGNOSIS — M48061 Spinal stenosis, lumbar region without neurogenic claudication: Secondary | ICD-10-CM | POA: Diagnosis not present

## 2024-02-26 DIAGNOSIS — M79604 Pain in right leg: Secondary | ICD-10-CM

## 2024-02-26 DIAGNOSIS — M47816 Spondylosis without myelopathy or radiculopathy, lumbar region: Secondary | ICD-10-CM | POA: Diagnosis not present

## 2024-03-04 ENCOUNTER — Other Ambulatory Visit (HOSPITAL_COMMUNITY): Payer: Self-pay

## 2024-03-08 ENCOUNTER — Ambulatory Visit (INDEPENDENT_AMBULATORY_CARE_PROVIDER_SITE_OTHER): Admitting: Orthopedic Surgery

## 2024-03-08 DIAGNOSIS — M79604 Pain in right leg: Secondary | ICD-10-CM | POA: Diagnosis not present

## 2024-03-08 DIAGNOSIS — M79605 Pain in left leg: Secondary | ICD-10-CM

## 2024-03-09 ENCOUNTER — Encounter: Payer: Self-pay | Admitting: Orthopedic Surgery

## 2024-03-09 ENCOUNTER — Other Ambulatory Visit: Payer: Self-pay

## 2024-03-09 NOTE — Progress Notes (Signed)
 Office Visit Note   Patient: Brenda Ruiz           Date of Birth: 1976-03-13           MRN: [id number] Visit Date: 03/08/2024 Requested by: Dorene Perkins, NP [address] [place],  [place] [address] PCP: Dorene Perkins, NP  Subjective: Chief Complaint  Patient presents with   Lower Back - Follow-up    HPI: Brenda Ruiz is a 48 y.o. female who presents to the office reporting improvement in back pain and bilateral leg pain.  She does not report any weakness.  Taking Tylenol for symptoms.  Tramadol  occasionally.  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  She states that her symptoms have improved enough that it is not really bad enough for an epidural steroid injection.  She has been going to the gym which initially was painful but now it has gotten better.  She is doing stretching as well as core strengthening exercises.  MRI scan is reviewed.  Also reviewed with the patient.  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  Does have an asymmetric disc which contacts the left S1 nerve root..                ROS: All systems reviewed are negative as they relate to the chief complaint within the history of present illness.  Patient denies fevers or chills.  Assessment & Plan: Visit Diagnoses:  1. Bilateral leg pain     Plan: Impression is low back pain with history of left leg radicular symptoms but overall that has improved.  Currently not severe enough for epidural steroid injection.  Should her symptoms worsen I asked her to call us  or send us  a MyChart message and we can get her set up to see Dr. Eldonna for some injections.  Continuing to do core strengthening and stretching exercises in the gym would be helpful for the back as well.  She will follow-up with us  as needed.  Follow-Up Instructions: No follow-ups on file.   Orders:  No orders of the defined types were  placed in this encounter.  No orders of the defined types were placed in this encounter.     Procedures: No procedures performed   Clinical Data: No additional findings.  Objective: Vital Signs: There were no vitals taken for this visit.  Physical Exam:  Constitutional: Patient appears well-developed HEENT:  Head: Normocephalic Eyes:EOM are normal Neck: Normal range of motion Cardiovascular: Normal rate Pulmonary/chest: Effort normal Neurologic: Patient is alert Skin: Skin is warm Psychiatric: Patient has normal mood and affect  Ortho Exam: Patient has bilateral 5 out of 5 ankle dorsiflexion plantarflexion quad hamstring and hip flexion strength.  Bilateral palpable pedal pulses and no paresthesias L1-S1 in either leg.  No nerve root tension signs.  Minimal pain with forward and lateral bending.  No groin pain with internal and external rotation of either leg.  No trochanteric tenderness.  No masses lymphadenopathy or skin changes around the back.   Specialty Comments:  No specialty comments available.  Imaging: No results found.   PMFS History: Patient Active Problem List   Diagnosis Date Noted   Endometriosis 04/18/2023   Pharmacologic therapy 04/18/2023   Disorder of skeletal system 04/18/2023   Problems influencing health status 04/18/2023   Sleep disorder breathing 01/11/2023   Bilateral primary osteoarthritis of knee  07/14/2022   Bladder spasms 07/14/2022   History of partial nephrectomy 07/14/2022   Increased frequency of urination 07/14/2022   Urge incontinence 07/14/2022   Urinary urgency 07/14/2022   Vitamin D  deficiency    Panic disorder (episodic paroxysmal anxiety)    Other vitamin B12 deficiency anemias    Morbid obesity due to excess calories (HCC)    Mixed irritable bowel syndrome    Mixed hyperlipidemia    GERD without esophagitis    Cough    Chronic pain syndrome    Chronic otitis media    BMI 45.0-49.9, adult (HCC)    Anxiety disorder,  unspecified    Solitary right kidney    Trigeminal neuralgia    Hypothyroid    IC (interstitial cystitis) 03/22/2018   Past Medical History:  Diagnosis Date   Acute upper respiratory infection, unspecified    Anxiety    Anxiety disorder, unspecified    Arthritis    Bilateral primary osteoarthritis of knee    right    Blood transfusion without reported diagnosis 1988   Left nephrectomy   BMI 45.0-49.9, adult (HCC)    Chronic otitis media    tubes in ears   Chronic pain syndrome    Cough    Depression    Endometriosis    Fever, unspecified    Fibromyalgia    GERD without esophagitis    Hypothyroid    IBS (irritable bowel syndrome)    Interstitial cystitis (chronic) without hematuria    Mixed hyperlipidemia    Mixed irritable bowel syndrome    Morbid obesity due to excess calories (HCC)    Other vitamin B12 deficiency anemias    Panic disorder (episodic paroxysmal anxiety)    Solitary right kidney    TMJ (temporomandibular joint disorder)    Trigeminal neuralgia    Trigeminal neuralgia    Vitamin D  deficiency     Family History  Problem Relation Age of Onset   Hypertension Mother    Hyperlipidemia Mother    Stroke Maternal Grandmother    Diabetes type II Maternal Grandfather    Hypertension Maternal Grandfather    Neuropathy Neg Hx    Colon cancer Neg Hx    Esophageal cancer Neg Hx     Past Surgical History:  Procedure Laterality Date   bladder stent  1999   carpel tunnel releae Right Mardh 2020   NEPHRECTOMY Left 1989   tubes in ears  06/2017   Social History   Occupational History   Not on file  Tobacco Use   Smoking status: Never    Passive exposure: Never   Smokeless tobacco: Never  Vaping Use   Vaping status: Never Used  Substance and Sexual Activity   Alcohol use: Yes    Comment: ocassionally/1-2 times a week    Drug use: Not Currently   Sexual activity: Not on file

## 2024-03-14 DIAGNOSIS — E038 Other specified hypothyroidism: Secondary | ICD-10-CM | POA: Diagnosis not present

## 2024-03-14 DIAGNOSIS — E782 Mixed hyperlipidemia: Secondary | ICD-10-CM | POA: Diagnosis not present

## 2024-03-14 DIAGNOSIS — R7303 Prediabetes: Secondary | ICD-10-CM | POA: Diagnosis not present

## 2024-03-14 DIAGNOSIS — D519 Vitamin B12 deficiency anemia, unspecified: Secondary | ICD-10-CM | POA: Diagnosis not present

## 2024-03-14 DIAGNOSIS — Z0000 Encounter for general adult medical examination without abnormal findings: Secondary | ICD-10-CM | POA: Diagnosis not present

## 2024-03-14 DIAGNOSIS — D518 Other vitamin B12 deficiency anemias: Secondary | ICD-10-CM | POA: Diagnosis not present

## 2024-03-14 NOTE — Progress Notes (Signed)
 Office Visit Note  Patient: Brenda Ruiz             Date of Birth: 1976-07-08           MRN: [id number]             PCP: Dorene Perkins, NP Referring: Addie Cordella Hamilton, MD Visit Date: 03/22/2024 Occupation: @GUAROCC @  Subjective:  Pain in multiple joints  History of Present Illness: Brenda Ruiz is a 48 y.o. female seen for the evaluation of polyarthralgia and elevated C-reactive protein.  According the patient her symptoms started about 6 years ago with bilateral knee joint pain.  She states she was seen by [facility] orth[facility] and was diagnosed with osteoarthritis.  She took Tylenol here and there.  She states 4 months ago she started having increased knee joint pain and also discomfort in her both hands and wrist joints.  She was also having lower back pain.  [facility] where she had x-rays of her both knee joints which were unremarkable.  No treatment was advised.  She was also having lower back pain for which she had MRI of the lumbar spine it showed degenerative changes.  She was offered epidural injection if the symptoms get worse.  She states her symptoms of the lower back improved.  She has also had neck discomfort in the past and has seen a Land.  None of the other joints are painful.  She notices swelling intermittently in her hands and her knee joints.  She has morning stiffness lasting for about 3 to 4 hours.  There is no history of oral ulcers, nasal ulcers, malar rash, sicca symptoms, Raynaud's, photosensitivity or lymphadenopathy. She is married, gravida 0.  Birth control method is oral contraceptive pills.  She is right-handed she works as a Associate Professor for [employer].  She enjoys watching movies.  She started going to the gym and does aerobics and weights.  There is no history of DVTs.  She drinks alcohol occasionally and has never been a smoker.    Activities of Daily Living:  Patient reports morning stiffness for 3 hours.    Patient Reports nocturnal pain.  Difficulty dressing/grooming: Denies Difficulty climbing stairs: Denies Difficulty getting out of chair: Denies Difficulty using hands for taps, buttons, cutlery, and/or writing: Reports  Review of Systems  Constitutional:  Positive for fatigue.  HENT:  Negative for mouth sores and mouth dryness.   Eyes:  Negative for dryness.  Respiratory:  Negative for shortness of breath.   Cardiovascular:  Negative for chest pain and palpitations.  Gastrointestinal:  Positive for nausea. Negative for blood in stool, constipation and diarrhea.  Endocrine: Negative for increased urination.  Genitourinary:  Negative for involuntary urination.  Musculoskeletal:  Positive for joint pain, joint pain, joint swelling, myalgias, morning stiffness, muscle tenderness and myalgias. Negative for gait problem and muscle weakness.  Skin:  Negative for color change, rash, hair loss and sensitivity to sunlight.  Allergic/Immunologic: Positive for susceptible to infections.  Neurological:  Negative for dizziness and headaches.  Hematological:  Negative for swollen glands.  Psychiatric/Behavioral:  Negative for depressed mood and sleep disturbance. The patient is nervous/anxious.     PMFS History:  Patient Active Problem List   Diagnosis Date Noted   Endometriosis 04/18/2023   Pharmacologic therapy 04/18/2023   Disorder of skeletal system 04/18/2023   Problems influencing health status 04/18/2023   Sleep disorder breathing 01/11/2023   Bilateral primary osteoarthritis of knee 07/14/2022  Bladder spasms 07/14/2022   History of partial nephrectomy 07/14/2022   Increased frequency of urination 07/14/2022   Urge incontinence 07/14/2022   Urinary urgency 07/14/2022   Vitamin D  deficiency    Panic disorder (episodic paroxysmal anxiety)    Other vitamin B12 deficiency anemias    Morbid obesity due to excess calories (HCC)    Mixed irritable bowel syndrome    Mixed  hyperlipidemia    GERD without esophagitis    Cough    Chronic pain syndrome    Chronic otitis media    BMI 45.0-49.9, adult (HCC)    Anxiety disorder, unspecified    Solitary right kidney    Trigeminal neuralgia    Hypothyroid    IC (interstitial cystitis) 03/22/2018    Past Medical History:  Diagnosis Date   Acute upper respiratory infection, unspecified    Anxiety    Anxiety disorder, unspecified    Arthritis    Bilateral primary osteoarthritis of knee    right    Blood transfusion without reported diagnosis 1988   Left nephrectomy   BMI 45.0-49.9, adult (HCC)    Chronic otitis media    tubes in ears   Chronic pain syndrome    Cough    Depression    Endometriosis    Fever, unspecified    Fibromyalgia    GERD without esophagitis    Hypothyroid    IBS (irritable bowel syndrome)    Interstitial cystitis (chronic) without hematuria    Mixed hyperlipidemia    Mixed irritable bowel syndrome    Morbid obesity due to excess calories (HCC)    Other vitamin B12 deficiency anemias    Panic disorder (episodic paroxysmal anxiety)    Solitary right kidney    TMJ (temporomandibular joint disorder)    Trigeminal neuralgia    Trigeminal neuralgia    Vitamin D  deficiency     Family History  Problem Relation Age of Onset   Hypertension Mother    Hyperlipidemia Mother    Heart attack Mother    Heart attack Maternal Grandmother    Stroke Maternal Grandmother    Diabetes type II Maternal Grandfather    Hypertension Maternal Grandfather    Neuropathy Neg Hx    Colon cancer Neg Hx    Esophageal cancer Neg Hx    Past Surgical History:  Procedure Laterality Date   APPENDECTOMY  08/2023   bladder stent  1999   CARPAL TUNNEL RELEASE Left    carpel tunnel releae Right Mardh 2020   NEPHRECTOMY Left 1989   tubes in ears  06/2017   Social History   Social History Narrative   ** Merged History Encounter **       Immunization History  Administered Date(s) Administered    Influenza, Seasonal, Injecte, Preservative Fre 05/06/2023     Objective: Vital Signs: BP (!) 143/88 (BP Location: Right Arm, Patient Position: Sitting, Cuff Size: Large)   Pulse 73   Resp 17   Ht 5' 5 (1.651 m)   Wt 281 lb 3.2 oz (127.6 kg)   BMI 46.79 kg/m    Physical Exam Vitals and nursing note reviewed.  Constitutional:      Appearance: She is well-developed.  HENT:     Head: Normocephalic and atraumatic.  Eyes:     Conjunctiva/sclera: Conjunctivae normal.  Cardiovascular:     Rate and Rhythm: Normal rate and regular rhythm.     Heart sounds: Normal heart sounds.  Pulmonary:     Effort: Pulmonary effort is  normal.     Breath sounds: Normal breath sounds.  Abdominal:     General: Bowel sounds are normal.     Palpations: Abdomen is soft.  Musculoskeletal:     Cervical back: Normal range of motion.  Lymphadenopathy:     Cervical: No cervical adenopathy.  Skin:    General: Skin is warm and dry.     Capillary Refill: Capillary refill takes less than 2 seconds.  Neurological:     Mental Status: She is alert and oriented to person, place, and time.  Psychiatric:        Behavior: Behavior normal.      Musculoskeletal Exam: Cervical, thoracic and lumbar spine were in good range of motion.  She had discomfort range of motion of the lumbar spine.  Shoulders, elbow joints, wrist joints, MCPs PIPs and DIPs with good range of motion.  She had no synovitis of her wrist joints or MCP joints.  Bilateral PIP and DIP thickening was noted.  No synovitis was noted.  Hip joints and knee joints were in good range of motion.  No warmth swelling or effusion was noted.  There was no tenderness over her ankles or MTPs.  Bilateral bunions were noted.  CDAI Exam: CDAI Score: -- Patient Global: --; Provider Global: -- Swollen: --; Tender: -- Joint Exam 03/22/2024   No joint exam has been documented for this visit   There is currently no information documented on the homunculus. Go to the  Rheumatology activity and complete the homunculus joint exam.  Investigation: No additional findings.  Imaging: MR Lumbar Spine w/o contrast Result Date: 03/04/2024 CLINICAL DATA:  48 year old female with burning, tingling and swelling in the bilateral lower extremities for several months. EXAM: MRI LUMBAR SPINE WITHOUT CONTRAST TECHNIQUE: Multiplanar, multisequence MR imaging of the lumbar spine was performed. No intravenous contrast was administered. COMPARISON:  Lumbar radiographs 02/11/2024. FINDINGS: Segmentation:  Normal on the comparison. Alignment: Stable lumbar lordosis. No significant scoliosis or spondylolisthesis. Vertebrae: Normal background bone marrow signal. Maintained vertebral height. Disc and endplate degeneration at L5-S1, see details below, including anterior and far lateral degenerative appearing endplate marrow edema bilaterally (series 107, image 10). Otherwise intact visible sacrum, SI joints, and no other marrow edema identified. Conus medullaris and cauda equina: Conus extends to the T12 level. No lower spinal cord or conus signal abnormality. Paraspinal and other soft tissues: Negative. Disc levels: T11-T12 through L3-L4: Negative. L4-L5: Negative disc. Mild circumferential endplate spurring. Mild to moderate facet hypertrophy. No spinal or lateral recess stenosis. Borderline to mild L4 neural foraminal stenosis. L5-S1: Disc desiccation, disc space loss. Circumferential disc osteophyte complex. Asymmetric left lateral recess component of disc best seen on sagittal image 10 series 105. Mild to moderate facet hypertrophy. No spinal stenosis. Mild posterior displacement of the descending left S1 nerve roots in the left lateral recess. Moderate to severe bilateral L5 neural foraminal stenosis (on the right series 105, image 4). IMPRESSION: 1. Bulky L5-S1 disc and endplate degeneration with mild degenerative endplate marrow edema. Moderate to severe biforaminal stenosis, and asymmetric  disc contacts the left S1 nerve roots in the lateral recess. Query L5 and/or Left S1 radiculitis. 2. No other significant disc degeneration. Up to moderate facet hypertrophy at both L4-L5 and L5-S1. Electronically Signed   By: VEAR Hurst M.D.   On: 03/04/2024 10:46    Recent Labs: No results found for: WBC, HGB, PLT, NA, K, CL, CO2, GLUCOSE, BUN, CREATININE, BILITOT, ALKPHOS, AST, ALT, PROT, ALBUMIN, CALCIUM , GFRAA, QFTBGOLD, QFTBGOLDPLUS  February 09, 2024 CRP 1.30, sed rate 19, RF 10, TPO antibody 194, C3-C4 normal, ANA negative, ENA (RNP Smith, ribosomal P, SCL 70, dsDNA, SSA, SSB) negative, ANA, CMP AST 10 ALT 13, GFR 107, hemoglobin A1c 5.9, lipid panel triglycerides 300, LDL 158, TSH 2.76, B12 310   Speciality Comments: No specialty comments available.  Procedures:  No procedures performed Allergies: Patient has no allergy information on record.   Assessment / Plan:     Visit Diagnoses: Pain in both hands -she complains of pain and discomfort in her bilateral hands.  Bilateral PIP and DIP thickening was noted.  No MCP or wrist joint swelling was noted.  No synovitis was noted over PIP and DIP joints.  I will obtain x-rays and labs today.  I will also refer her to physical therapy.  A handout on hand muscle strengthening exercise was given.  Detailed counsel regarding osteoarthritis was provided.  Plan: XR Hand 2 View Right, XR Hand 2 View Left, x-rays obtained today were suggestive of osteoarthritis.  Cyclic citrul peptide antibody, IgG, Mutated Citrullinated Vimentin (MCV) Antibody  Pain in both lower legs -she complains of discomfort in the bilateral knee joints.  No synovitis was noted.  A handout on lower extremity muscle strengthening exercise was given.  X-rays of bilateral knee joints were unremarkable obtained by Dr. Addie which were reviewed.  Degeneration of intervertebral disc of lumbar region without discogenic back pain or lower extremity  pain-patient had x-rays and MRI of the lumbar spine which were reviewed showed bulky L5-S1 disc and endplate degeneration with mild degenerative endplate marrow edema.  She had moderate to severe biforaminal stenosis.  Patient states she was advised to epidural if the symptoms get worse.  I will refer her to physical therapy.  Elevated CRP-CRP is mildly elevated.  Sed rate was normal.  ANA and ENA panel was negative which was reviewed with the patient.  Complements were normal.  She has no clinical features of lupus or related disease.  Other fatigue - Plan: CBC with Differential/Platelet, CK, Serum protein electrophoresis with reflex  Other medical problems are listed as follows:  Chronic pain syndrome  Trigeminal neuralgia  Vitamin D  deficiency -patient states her vitamin D  was normal recently.  Other vitamin B12 deficiency anemias-B12 was normal recently.  Mixed hyperlipidemia  Mixed irritable bowel syndrome  GERD without esophagitis  IC (interstitial cystitis)  History of partial nephrectomy  Other specified hypothyroidism  Endometriosis  Anxiety  Panic disorder (episodic paroxysmal anxiety)  Morbid obesity due to excess calories (HCC)    Orders: Orders Placed This Encounter  Procedures   XR Hand 2 View Right   XR Hand 2 View Left   CBC with Differential/Platelet   CK   Cyclic citrul peptide antibody, IgG   Mutated Citrullinated Vimentin (MCV) Antibody   Serum protein electrophoresis with reflex   No orders of the defined types were placed in this encounter.   Face-to-face time spent with patient was 45 minutes. Greater than 50% of time was spent in counseling and coordination of care.  Follow-Up Instructions: Return for Osteoarthritis.   Maya Nash, MD  Note - This record has been created using Animal nutritionist.  Chart creation errors have been sought, but may not always  have been located. Such creation errors do not reflect on  the standard of  medical care.

## 2024-03-16 ENCOUNTER — Other Ambulatory Visit (HOSPITAL_COMMUNITY): Payer: Self-pay

## 2024-03-16 ENCOUNTER — Encounter (HOSPITAL_BASED_OUTPATIENT_CLINIC_OR_DEPARTMENT_OTHER): Payer: Self-pay

## 2024-03-16 ENCOUNTER — Other Ambulatory Visit (HOSPITAL_BASED_OUTPATIENT_CLINIC_OR_DEPARTMENT_OTHER): Payer: Self-pay

## 2024-03-16 MED ORDER — ROSUVASTATIN CALCIUM 10 MG PO TABS
10.0000 mg | ORAL_TABLET | Freq: Every day | ORAL | 1 refills | Status: AC
  Filled 2024-03-16 (×2): qty 30, 30d supply, fill #0
  Filled 2024-06-13: qty 30, 30d supply, fill #1

## 2024-03-17 ENCOUNTER — Other Ambulatory Visit (HOSPITAL_COMMUNITY): Payer: Self-pay

## 2024-03-22 ENCOUNTER — Ambulatory Visit: Attending: Rheumatology | Admitting: Rheumatology

## 2024-03-22 ENCOUNTER — Encounter: Payer: Self-pay | Admitting: Rheumatology

## 2024-03-22 ENCOUNTER — Ambulatory Visit

## 2024-03-22 ENCOUNTER — Ambulatory Visit (INDEPENDENT_AMBULATORY_CARE_PROVIDER_SITE_OTHER)

## 2024-03-22 VITALS — BP 145/77 | HR 73 | Resp 17 | Ht 65.0 in | Wt 281.2 lb

## 2024-03-22 DIAGNOSIS — G500 Trigeminal neuralgia: Secondary | ICD-10-CM | POA: Diagnosis not present

## 2024-03-22 DIAGNOSIS — R5383 Other fatigue: Secondary | ICD-10-CM | POA: Diagnosis not present

## 2024-03-22 DIAGNOSIS — F410 Panic disorder [episodic paroxysmal anxiety] without agoraphobia: Secondary | ICD-10-CM

## 2024-03-22 DIAGNOSIS — M51369 Other intervertebral disc degeneration, lumbar region without mention of lumbar back pain or lower extremity pain: Secondary | ICD-10-CM

## 2024-03-22 DIAGNOSIS — M79642 Pain in left hand: Secondary | ICD-10-CM | POA: Diagnosis not present

## 2024-03-22 DIAGNOSIS — E782 Mixed hyperlipidemia: Secondary | ICD-10-CM | POA: Diagnosis not present

## 2024-03-22 DIAGNOSIS — K219 Gastro-esophageal reflux disease without esophagitis: Secondary | ICD-10-CM | POA: Diagnosis not present

## 2024-03-22 DIAGNOSIS — M79641 Pain in right hand: Secondary | ICD-10-CM

## 2024-03-22 DIAGNOSIS — Z905 Acquired absence of kidney: Secondary | ICD-10-CM

## 2024-03-22 DIAGNOSIS — R7982 Elevated C-reactive protein (CRP): Secondary | ICD-10-CM

## 2024-03-22 DIAGNOSIS — E038 Other specified hypothyroidism: Secondary | ICD-10-CM

## 2024-03-22 DIAGNOSIS — G894 Chronic pain syndrome: Secondary | ICD-10-CM | POA: Diagnosis not present

## 2024-03-22 DIAGNOSIS — K582 Mixed irritable bowel syndrome: Secondary | ICD-10-CM | POA: Diagnosis not present

## 2024-03-22 DIAGNOSIS — N3010 Interstitial cystitis (chronic) without hematuria: Secondary | ICD-10-CM

## 2024-03-22 DIAGNOSIS — M79662 Pain in left lower leg: Secondary | ICD-10-CM

## 2024-03-22 DIAGNOSIS — E559 Vitamin D deficiency, unspecified: Secondary | ICD-10-CM | POA: Diagnosis not present

## 2024-03-22 DIAGNOSIS — M79661 Pain in right lower leg: Secondary | ICD-10-CM | POA: Diagnosis not present

## 2024-03-22 DIAGNOSIS — F419 Anxiety disorder, unspecified: Secondary | ICD-10-CM

## 2024-03-22 DIAGNOSIS — N809 Endometriosis, unspecified: Secondary | ICD-10-CM

## 2024-03-22 DIAGNOSIS — D518 Other vitamin B12 deficiency anemias: Secondary | ICD-10-CM | POA: Diagnosis not present

## 2024-03-22 NOTE — Patient Instructions (Signed)
 Osteoarthritis  Osteoarthritis is a type of arthritis. It refers to joint pain or joint disease. Osteoarthritis affects tissue that covers the ends of bones in joints (cartilage). Cartilage acts as a cushion between the bones and helps them move smoothly. Osteoarthritis occurs when cartilage in the joints gets worn down. Osteoarthritis is sometimes called "wear and tear" arthritis. Osteoarthritis is the most common form of arthritis. It often occurs in older people. It is a condition that gets worse over time. The joints most often affected by this condition are in the fingers, toes, hips, knees, and spine, including the neck and lower back. What are the causes? This condition is caused by the wearing down of cartilage that covers the ends of bones. What increases the risk? The following factors may make you more likely to develop this condition: Being age 48 or older. Obesity. Overuse of joints. Past injury of a joint. Past surgery on a joint. Family history of osteoarthritis. What are the signs or symptoms? The main symptoms of this condition are pain, swelling, and stiffness in the joint. Other symptoms may include: An enlarged joint. More pain and further damage caused by small pieces of bone or cartilage that break off and float inside of the joint. Small deposits of bone (osteophytes) that grow on the edges of the joint. A grating or scraping feeling inside the joint when you move it. Popping or creaking sounds when you move. Difficulty walking or exercising. An inability to grip items, twist your hand, or control the movements of your hands and fingers. How is this diagnosed? This condition may be diagnosed based on: Your medical history. A physical exam. Your symptoms. X-rays of the affected joints. Blood tests to rule out other types of arthritis. How is this treated? There is no cure for this condition, but treatment can help control pain and improve joint function. Treatment  may include a combination of therapies, such as: Pain relief techniques, such as: Applying heat and cold to the joint. Massage. A form of talk therapy called cognitive behavioral therapy (CBT). This therapy helps you set goals and follow up on the changes that you make. Medicines for pain and inflammation. The medicines can be taken by mouth or applied to the skin. They include: NSAIDs, such as ibuprofen. Prescription medicines. Strong anti-inflammatory medicines (corticosteroids). Certain nutritional supplements. A prescribed exercise program. You may work with a physical therapist. Assistive devices, such as a brace, wrap, splint, specialized glove, or cane. A weight control plan. Surgery, such as: An osteotomy. This is done to reposition the bones and relieve pain or to remove loose pieces of bone and cartilage. Joint replacement surgery. You may need this surgery if you have advanced osteoarthritis. Follow these instructions at home: Activity Rest your affected joints as told by your health care provider. Exercise as told by your provider. The provider may recommend specific types of exercise, such as: Strengthening exercises. These are done to strengthen the muscles that support joints affected by arthritis. Aerobic activities. These are exercises, such as brisk walking or water aerobics, that increase your heart rate. Range-of-motion activities. These help your joints move more easily. Balance and agility exercises. Managing pain, stiffness, and swelling     If told, apply heat to the affected area as often as told by your provider. Use the heat source that your provider recommends, such as a moist heat pack or a heating pad. If you have a removable assistive device, remove it as told by your provider. Place a  towel between your skin and the heat source. If your provider tells you to keep the assistive device on while you apply heat, place a towel between the assistive device and  the heat source. Leave the heat on for 20-30 minutes. If told, put ice on the affected area. If you have a removable assistive device, remove it as told by your provider. Put ice in a plastic bag. Place a towel between your skin and the bag. If your provider tells you to keep the assistive device on during icing, place a towel between the assistive device and the bag. Leave the ice on for 20 minutes, 2-3 times a day. If your skin turns bright red, remove the ice or heat right away to prevent skin damage. The risk of damage is higher if you cannot feel pain, heat, or cold. Move your fingers or toes often to reduce stiffness and swelling. Raise (elevate) the affected area above the level of your heart while you are sitting or lying down. General instructions Take over-the-counter and prescription medicines only as told by your provider. Maintain a healthy weight. Follow instructions from your provider for weight control. Do not use any products that contain nicotine or tobacco. These products include cigarettes, chewing tobacco, and vaping devices, such as e-cigarettes. If you need help quitting, ask your provider. Use assistive devices as told by your provider. Where to find more information [employer] of Arthritis and Musculoskeletal and Skin Diseases: niams.http://www.myers.net/ [employer] on Aging: BaseRingTones.pl American College of Rheumatology: rheumatology.org Contact a health care provider if: You have redness, swelling, or a feeling of warmth in a joint that gets worse. You have a fever along with joint or muscle aches. You develop a rash. You have trouble doing your normal activities. You have pain that gets worse and is not relieved by pain medicine. This information is not intended to replace advice given to you by your health care provider. Make sure you discuss any questions you have with your health care provider. Document Revised: 03/12/2022 Document Reviewed:  03/12/2022 Elsevier Patient Education  2024 [employer].  .Hand Exercises Hand exercises can be helpful for almost anyone. They can strengthen your hands and improve flexibility and movement. The exercises can also increase blood flow to the hands. These results can make your work and daily tasks easier for you. Hand exercises can be especially helpful for people who have joint pain from arthritis or nerve damage from using their hands over and over. These exercises can also help people who injure a hand. Exercises Most of these hand exercises are gentle stretching and motion exercises. It is usually safe to do them often throughout the day. Warming up your hands before exercise may help reduce stiffness. You can do this with gentle massage or by placing your hands in warm water for 10-15 minutes. It is normal to feel some stretching, pulling, tightness, or mild discomfort when you begin new exercises. In time, this will improve. Remember to always be careful and stop right away if you feel sudden, very bad pain or your pain gets worse. You want to get better and be safe. Ask your health care provider which exercises are safe for you. Do exercises exactly as told by your provider and adjust them as told. Do not begin these exercises until told by your provider. Knuckle bend or "claw" fist  Stand or sit with your arm, hand, and all five fingers pointed straight up. Make sure to keep your wrist straight. Gently  bend your fingers down toward your palm until the tips of your fingers are touching your palm. Keep your big knuckle straight and only bend the small knuckles in your fingers. Hold this position for 10 seconds. Straighten your fingers back to your starting position. Repeat this exercise 5-10 times with each hand. Full finger fist  Stand or sit with your arm, hand, and all five fingers pointed straight up. Make sure to keep your wrist straight. Gently bend your fingers into your palm until  the tips of your fingers are touching the middle of your palm. Hold this position for 10 seconds. Extend your fingers back to your starting position, stretching every joint fully. Repeat this exercise 5-10 times with each hand. Straight fist  Stand or sit with your arm, hand, and all five fingers pointed straight up. Make sure to keep your wrist straight. Gently bend your fingers at the big knuckle, where your fingers meet your hand, and at the middle knuckle. Keep the knuckle at the tips of your fingers straight and try to touch the bottom of your palm. Hold this position for 10 seconds. Extend your fingers back to your starting position, stretching every joint fully. Repeat this exercise 5-10 times with each hand. Tabletop  Stand or sit with your arm, hand, and all five fingers pointed straight up. Make sure to keep your wrist straight. Gently bend your fingers at the big knuckle, where your fingers meet your hand, as far down as you can. Keep the small knuckles in your fingers straight. Think of forming a tabletop with your fingers. Hold this position for 10 seconds. Extend your fingers back to your starting position, stretching every joint fully. Repeat this exercise 5-10 times with each hand. Finger spread  Place your hand flat on a table with your palm facing down. Make sure your wrist stays straight. Spread your fingers and thumb apart from each other as far as you can until you feel a gentle stretch. Hold this position for 10 seconds. Bring your fingers and thumb tight together again. Hold this position for 10 seconds. Repeat this exercise 5-10 times with each hand. Making circles  Stand or sit with your arm, hand, and all five fingers pointed straight up. Make sure to keep your wrist straight. Make a circle by touching the tip of your thumb to the tip of your index finger. Hold for 10 seconds. Then open your hand wide. Repeat this motion with your thumb and each of your  fingers. Repeat this exercise 5-10 times with each hand. Thumb motion  Sit with your forearm resting on a table and your wrist straight. Your thumb should be facing up toward the ceiling. Keep your fingers relaxed as you move your thumb. Lift your thumb up as high as you can toward the ceiling. Hold for 10 seconds. Bend your thumb across your palm as far as you can, reaching the tip of your thumb for the small finger (pinkie) side of your palm. Hold for 10 seconds. Repeat this exercise 5-10 times with each hand. Grip strengthening  Hold a stress ball or other soft ball in the middle of your hand. Slowly increase the pressure, squeezing the ball as much as you can without causing pain. Think of bringing the tips of your fingers into the middle of your palm. All of your finger joints should bend when doing this exercise. Hold your squeeze for 10 seconds, then relax. Repeat this exercise 5-10 times with each hand. Contact a health  care provider if: Your hand pain or discomfort gets much worse when you do an exercise. Your hand pain or discomfort does not improve within 2 hours after you exercise. If you have either of these problems, stop doing these exercises right away. Do not do them again unless your provider says that you can. Get help right away if: You develop sudden, severe hand pain or swelling. If this happens, stop doing these exercises right away. Do not do them again unless your provider says that you can. This information is not intended to replace advice given to you by your health care provider. Make sure you discuss any questions you have with your health care provider. Document Revised: 07/28/2022 Document Reviewed: 07/28/2022 Elsevier Patient Education  2024 [employer].  .Exercises for Chronic Knee Pain Chronic knee pain is pain that lasts longer than 3 months. For most people with chronic knee pain, exercise and weight loss is an important part of treatment. Your health  care provider may want you to focus on: Making the muscles that support your knee stronger. This can take pressure off your knee and reduce pain. Preventing knee stiffness. How far you can move your knee, keeping it there or making it farther. Losing weight (if this applies) to take pressure off your knee, lower your risk for injury, and make it easier for you to exercise. Your provider will help you make an exercise program that fits your needs and physical abilities. Below are simple, low-impact exercises you can do at home. Ask your provider or physical therapist how often you should do your exercise program and how many times to repeat each exercise. General safety tips  Get your provider's approval before doing any exercises. Start slowly and stop any time you feel pain. Do not exercise if your knee pain is flaring up. Warm up first. Stretching a cold muscle can cause an injury. Do 5-10 minutes of easy movement or light stretching before beginning your exercises. Do 5-10 minutes of low-impact activity (like walking or cycling) before starting strengthening exercises. Contact your provider any time you have pain during or after exercising. Exercise can cause discomfort but should not be painful. It is normal to be a little stiff or sore after exercising. Stretching and range-of-motion exercises Front thigh stretch  Stand up straight and support your body by holding on to a chair or resting one hand on a wall. With your legs straight and close together, bend one knee to lift your heel up toward your butt. Using one hand for support, grab your ankle with your free hand. Pull your foot up closer toward your butt to feel the stretch in front of your thigh. Hold the stretch for 30 seconds. Repeat __________ times. Complete this exercise __________ times a day. Back thigh stretch  Sit on the floor with your back straight and your legs out straight in front of you. Place the palms of your  hands on the floor and slide them toward your feet as you bend at the hip. Try to touch your nose to your knees and feel the stretch in the back of your thighs. Hold for 30 seconds. Repeat __________ times. Complete this exercise __________ times a day. Calf stretch  Stand facing a wall. Place the palms of your hands flat against the wall, arms extended, and lean slightly against the wall. Get into a lunge position with one leg bent at the knee and the other leg stretched out straight behind you. Keep both feet  facing the wall and increase the bend in your knee while keeping the heel of the other leg flat on the ground. You should feel the stretch in your calf. Hold for 30 seconds. Repeat __________ times. Complete this exercise __________ times a day. Strengthening exercises Straight leg lift  Lie on your back with one knee bent and the other leg out straight. Slowly lift the straight leg without bending the knee. Lift until your foot is about 12 inches (30 cm) off the floor. Hold for 3-5 seconds and slowly lower your leg. Repeat __________ times. Complete this exercise __________ times a day. Single leg dip  Stand between two chairs and put both hands on the backs of the chairs for support. Extend one leg out straight with your body weight resting on the heel of the standing leg. Slowly bend your standing knee to dip your body to the level that is comfortable for you. Hold for 3-5 seconds. Repeat __________ times. Complete this exercise __________ times a day. Hamstring curls  Stand straight, knees close together, facing the back of a chair. Hold on to the back of a chair with both hands. Keep one leg straight. Bend the other knee while bringing the heel up toward the butt until the knee is bent at a 90-degree angle (right angle). Hold for 3-5 seconds. Repeat __________ times. Complete this exercise __________ times a day. Wall squat  Stand straight with your back, hips, and  head against a wall. Step forward one foot at a time with your back still against the wall. Your feet should be 2 feet (61 cm) from the wall at shoulder width. Keeping your back, hips, and head against the wall, slide down the wall to as close to a sitting position as you can get. Hold for 5-10 seconds, then slowly slide back up. Repeat __________ times. Complete this exercise __________ times a day. Step-ups  Stand in front of a sturdy platform or stool that is about 6 inches (15 cm) high. Slowly step up with your left / right foot, keeping your knee in line with your hip and foot. Do not let your knee bend so far that you cannot see your toes. Hold on to a chair for balance, but do not use it for support. Slowly unlock your knee and lower yourself to the starting position. Repeat __________ times. Complete this exercise __________ times a day. Contact a health care provider if: Your exercises cause pain. Your pain is worse after you exercise. Your pain prevents you from doing your exercises. This information is not intended to replace advice given to you by your health care provider. Make sure you discuss any questions you have with your health care provider. Document Revised: 07/28/2022 Document Reviewed: 07/28/2022 Elsevier Patient Education  2024 [employer].

## 2024-03-24 LAB — CBC WITH DIFFERENTIAL/PLATELET
Absolute Lymphocytes: 3134 {cells}/uL (ref 850–3900)
Absolute Monocytes: 811 {cells}/uL (ref 200–950)
Basophils Absolute: 48 {cells}/uL (ref 0–200)
Basophils Relative: 0.4 %
Eosinophils Absolute: 182 {cells}/uL (ref 15–500)
Eosinophils Relative: 1.5 %
HCT: 41.4 % (ref 35.0–45.0)
Hemoglobin: 13.3 g/dL (ref 11.7–15.5)
MCH: 25.7 pg — ABNORMAL LOW (ref 27.0–33.0)
MCHC: 32.1 g/dL (ref 32.0–36.0)
MCV: 79.9 fL — ABNORMAL LOW (ref 80.0–100.0)
MPV: 8.8 fL (ref 7.5–12.5)
Monocytes Relative: 6.7 %
Neutro Abs: 7926 {cells}/uL — ABNORMAL HIGH (ref 1500–7800)
Neutrophils Relative %: 65.5 %
Platelets: 457 Thousand/uL — ABNORMAL HIGH (ref 140–400)
RBC: 5.18 Million/uL — ABNORMAL HIGH (ref 3.80–5.10)
RDW: 13.6 % (ref 11.0–15.0)
Total Lymphocyte: 25.9 %
WBC: 12.1 Thousand/uL — ABNORMAL HIGH (ref 3.8–10.8)

## 2024-03-24 LAB — PROTEIN ELECTROPHORESIS, SERUM, WITH REFLEX
Albumin ELP: 4.3 g/dL (ref 3.8–4.8)
Alpha 1: 0.4 g/dL — ABNORMAL HIGH (ref 0.2–0.3)
Alpha 2: 0.9 g/dL (ref 0.5–0.9)
Beta 2: 0.5 g/dL (ref 0.2–0.5)
Beta Globulin: 0.6 g/dL (ref 0.4–0.6)
Gamma Globulin: 1 g/dL (ref 0.8–1.7)
Total Protein: 7.6 g/dL (ref 6.1–8.1)

## 2024-03-24 LAB — CYCLIC CITRUL PEPTIDE ANTIBODY, IGG: Cyclic Citrullin Peptide Ab: <16 U

## 2024-03-24 LAB — CK: Total CK: 288 U/L — ABNORMAL HIGH (ref 20–239)

## 2024-03-28 ENCOUNTER — Encounter

## 2024-03-28 ENCOUNTER — Other Ambulatory Visit (HOSPITAL_COMMUNITY): Payer: Self-pay

## 2024-03-28 ENCOUNTER — Other Ambulatory Visit: Payer: Self-pay

## 2024-03-28 MED ORDER — UBRELVY 100 MG PO TABS
ORAL_TABLET | ORAL | 2 refills | Status: AC
  Filled 2024-03-28: qty 16, 30d supply, fill #0
  Filled 2024-06-13: qty 16, 30d supply, fill #1
  Filled 2024-07-10: qty 16, 30d supply, fill #2

## 2024-03-29 ENCOUNTER — Other Ambulatory Visit (HOSPITAL_COMMUNITY): Payer: Self-pay

## 2024-03-29 MED ORDER — TRAMADOL HCL 50 MG PO TABS
50.0000 mg | ORAL_TABLET | Freq: Four times a day (QID) | ORAL | 0 refills | Status: DC
  Filled 2024-03-29: qty 20, 5d supply, fill #0

## 2024-04-04 DIAGNOSIS — J069 Acute upper respiratory infection, unspecified: Secondary | ICD-10-CM | POA: Diagnosis not present

## 2024-04-04 DIAGNOSIS — R0981 Nasal congestion: Secondary | ICD-10-CM | POA: Diagnosis not present

## 2024-04-04 DIAGNOSIS — J029 Acute pharyngitis, unspecified: Secondary | ICD-10-CM | POA: Diagnosis not present

## 2024-04-04 DIAGNOSIS — R0982 Postnasal drip: Secondary | ICD-10-CM | POA: Diagnosis not present

## 2024-04-04 DIAGNOSIS — R051 Acute cough: Secondary | ICD-10-CM | POA: Diagnosis not present

## 2024-04-05 ENCOUNTER — Other Ambulatory Visit (HOSPITAL_COMMUNITY): Payer: Self-pay

## 2024-04-05 DIAGNOSIS — J018 Other acute sinusitis: Secondary | ICD-10-CM | POA: Diagnosis not present

## 2024-04-05 DIAGNOSIS — R070 Pain in throat: Secondary | ICD-10-CM | POA: Diagnosis not present

## 2024-04-05 DIAGNOSIS — U071 COVID-19: Secondary | ICD-10-CM | POA: Diagnosis not present

## 2024-04-05 DIAGNOSIS — Z20828 Contact with and (suspected) exposure to other viral communicable diseases: Secondary | ICD-10-CM | POA: Diagnosis not present

## 2024-04-06 ENCOUNTER — Ambulatory Visit: Admitting: Physical Therapy

## 2024-04-13 ENCOUNTER — Ambulatory Visit

## 2024-04-24 ENCOUNTER — Other Ambulatory Visit: Payer: Self-pay

## 2024-04-27 NOTE — Progress Notes (Deleted)
 Office Visit Note  Patient: Brenda Ruiz             Date of Birth: 07-Jun-1976           MRN: [id number]             PCP: Dorene Perkins, NP Referring: Dorene Perkins, NP Visit Date: 05/11/2024 Occupation: Data Unavailable  Subjective:  No chief complaint on file.   History of Present Illness: Brenda Ruiz is a 48 y.o. female ***     Activities of Daily Living:  Patient reports morning stiffness for *** {minute/hour:19697}.   Patient {ACTIONS;DENIES/REPORTS:21021675::Denies} nocturnal pain.  Difficulty dressing/grooming: {ACTIONS;DENIES/REPORTS:21021675::Denies} Difficulty climbing stairs: {ACTIONS;DENIES/REPORTS:21021675::Denies} Difficulty getting out of chair: {ACTIONS;DENIES/REPORTS:21021675::Denies} Difficulty using hands for taps, buttons, cutlery, and/or writing: {ACTIONS;DENIES/REPORTS:21021675::Denies}  No Rheumatology ROS completed.   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   Increased frequency of urination 07/14/2022   Urge incontinence 07/14/2022   Urinary urgency 07/14/2022   Vitamin D  deficiency    Panic disorder (episodic paroxysmal anxiety)    Other vitamin B12 deficiency anemias    Morbid obesity due to excess calories (HCC)    Mixed irritable bowel syndrome    Mixed hyperlipidemia    GERD without esophagitis    Cough    Chronic pain syndrome    Chronic otitis media    BMI 45.0-49.9, adult (HCC)    Anxiety disorder, unspecified    Solitary right kidney    Trigeminal neuralgia    Hypothyroid    IC (interstitial cystitis) 03/22/2018    Past Medical History:  Diagnosis Date   Acute upper  respiratory infection, unspecified    Anxiety    Anxiety disorder, unspecified    Arthritis    Bilateral primary osteoarthritis of knee    right    Blood transfusion without reported diagnosis 1988   Left nephrectomy   BMI 45.0-49.9, adult (HCC)    Chronic otitis media    tubes in ears   Chronic pain syndrome    Cough    Depression    Endometriosis    Fever, unspecified    Fibromyalgia    GERD without esophagitis    Hypothyroid    IBS (irritable bowel syndrome)    Interstitial cystitis (chronic) without hematuria    Mixed hyperlipidemia    Mixed irritable bowel syndrome    Morbid obesity due to excess calories (HCC)    Other vitamin B12 deficiency anemias    Panic disorder (episodic paroxysmal anxiety)    Solitary right kidney    TMJ (temporomandibular joint disorder)    Trigeminal neuralgia    Trigeminal neuralgia    Vitamin D  deficiency     Family History  Problem Relation Age of Onset   Hypertension Mother    Hyperlipidemia Mother    Heart attack Mother    Heart attack Maternal Grandmother    Stroke Maternal Grandmother    Diabetes type II Maternal Grandfather    Hypertension Maternal Grandfather    Neuropathy Neg Hx    Colon cancer Neg Hx    Esophageal cancer Neg Hx    Past Surgical History:  Procedure Laterality Date   APPENDECTOMY  08/2023  bladder stent  1999   CARPAL TUNNEL RELEASE Left    carpel tunnel releae Right Mardh 2020   NEPHRECTOMY Left 1989   tubes in ears  06/2017   Social History   Tobacco Use   Smoking status: Never    Passive exposure: Past   Smokeless tobacco: Never  Vaping Use   Vaping status: Never Used  Substance Use Topics   Alcohol use: Yes    Comment: ocassionally/1-2 times a week    Drug use: Not Currently   Social History   Social History Narrative   ** Merged History Encounter **         Immunization History  Administered Date(s) Administered   Influenza, Seasonal, Injecte, Preservative Fre 05/06/2023      Objective: Vital Signs: There were no vitals taken for this visit.   Physical Exam   Musculoskeletal Exam: ***  CDAI Exam: CDAI Score: -- Patient Global: --; Provider Global: -- Swollen: --; Tender: -- Joint Exam 05/11/2024   No joint exam has been documented for this visit   There is currently no information documented on the homunculus. Go to the Rheumatology activity and complete the homunculus joint exam.  Investigation: No additional findings.  Imaging: No results found.  Recent Labs: Lab Results  Component Value Date   WBC 12.1 (H) 03/22/2024   HGB 13.3 03/22/2024   PLT 457 (H) 03/22/2024   PROT 7.6 03/22/2024   March 22, 2024 WBC 12.1, platelets 457, SPEP normal, CK288, anti-CCP <16  February 09, 2024 CRP 1.30, sed rate 19, RF 10, TPO antibody 194, C3-C4 normal, ANA negative, ENA (RNP Smith, ribosomal P, SCL 70, dsDNA, SSA, SSB) negative, ANA, CMP AST 10 ALT 13, GFR 107, hemoglobin A1c 5.9, lipid panel triglycerides 300, LDL 158, TSH 2.76, B12 310   Speciality Comments: No specialty comments available.  Procedures:  No procedures performed Allergies: Patient has no allergy information on record.   Assessment / Plan:     Visit Diagnoses: No diagnosis found.  Orders: No orders of the defined types were placed in this encounter.  No orders of the defined types were placed in this encounter.   Face-to-face time spent with patient was *** minutes. Greater than 50% of time was spent in counseling and coordination of care.  Follow-Up Instructions: No follow-ups on file.   Maya Nash, MD  Note - This record has been created using Animal nutritionist.  Chart creation errors have been sought, but may not always  have been located. Such creation errors do not reflect on  the standard of medical care.

## 2024-05-03 ENCOUNTER — Other Ambulatory Visit (HOSPITAL_COMMUNITY): Payer: Self-pay

## 2024-05-03 ENCOUNTER — Other Ambulatory Visit: Payer: Self-pay

## 2024-05-03 DIAGNOSIS — E063 Autoimmune thyroiditis: Secondary | ICD-10-CM | POA: Diagnosis not present

## 2024-05-03 MED ORDER — SERTRALINE HCL 100 MG PO TABS
100.0000 mg | ORAL_TABLET | Freq: Every day | ORAL | 2 refills | Status: DC
  Filled 2024-05-03: qty 30, 30d supply, fill #0
  Filled 2024-06-04: qty 30, 30d supply, fill #1
  Filled 2024-07-03: qty 30, 30d supply, fill #2

## 2024-05-08 DIAGNOSIS — M5442 Lumbago with sciatica, left side: Secondary | ICD-10-CM | POA: Diagnosis not present

## 2024-05-08 DIAGNOSIS — G894 Chronic pain syndrome: Secondary | ICD-10-CM | POA: Diagnosis not present

## 2024-05-08 DIAGNOSIS — M5441 Lumbago with sciatica, right side: Secondary | ICD-10-CM | POA: Diagnosis not present

## 2024-05-08 DIAGNOSIS — M25542 Pain in joints of left hand: Secondary | ICD-10-CM | POA: Diagnosis not present

## 2024-05-08 DIAGNOSIS — M170 Bilateral primary osteoarthritis of knee: Secondary | ICD-10-CM | POA: Diagnosis not present

## 2024-05-08 DIAGNOSIS — M25541 Pain in joints of right hand: Secondary | ICD-10-CM | POA: Diagnosis not present

## 2024-05-09 DIAGNOSIS — M170 Bilateral primary osteoarthritis of knee: Secondary | ICD-10-CM | POA: Diagnosis not present

## 2024-05-09 DIAGNOSIS — E063 Autoimmune thyroiditis: Secondary | ICD-10-CM | POA: Diagnosis not present

## 2024-05-09 DIAGNOSIS — R110 Nausea: Secondary | ICD-10-CM | POA: Diagnosis not present

## 2024-05-09 DIAGNOSIS — E559 Vitamin D deficiency, unspecified: Secondary | ICD-10-CM | POA: Diagnosis not present

## 2024-05-09 DIAGNOSIS — I10 Essential (primary) hypertension: Secondary | ICD-10-CM | POA: Diagnosis not present

## 2024-05-09 DIAGNOSIS — E038 Other specified hypothyroidism: Secondary | ICD-10-CM | POA: Diagnosis not present

## 2024-05-09 DIAGNOSIS — J302 Other seasonal allergic rhinitis: Secondary | ICD-10-CM | POA: Diagnosis not present

## 2024-05-11 ENCOUNTER — Telehealth: Payer: Self-pay | Admitting: Rheumatology

## 2024-05-11 ENCOUNTER — Ambulatory Visit: Admitting: Rheumatology

## 2024-05-11 DIAGNOSIS — N809 Endometriosis, unspecified: Secondary | ICD-10-CM

## 2024-05-11 DIAGNOSIS — M79661 Pain in right lower leg: Secondary | ICD-10-CM

## 2024-05-11 DIAGNOSIS — F419 Anxiety disorder, unspecified: Secondary | ICD-10-CM

## 2024-05-11 DIAGNOSIS — F410 Panic disorder [episodic paroxysmal anxiety] without agoraphobia: Secondary | ICD-10-CM

## 2024-05-11 DIAGNOSIS — G500 Trigeminal neuralgia: Secondary | ICD-10-CM

## 2024-05-11 DIAGNOSIS — D518 Other vitamin B12 deficiency anemias: Secondary | ICD-10-CM

## 2024-05-11 DIAGNOSIS — E782 Mixed hyperlipidemia: Secondary | ICD-10-CM

## 2024-05-11 DIAGNOSIS — M79641 Pain in right hand: Secondary | ICD-10-CM

## 2024-05-11 DIAGNOSIS — E559 Vitamin D deficiency, unspecified: Secondary | ICD-10-CM

## 2024-05-11 DIAGNOSIS — K219 Gastro-esophageal reflux disease without esophagitis: Secondary | ICD-10-CM

## 2024-05-11 DIAGNOSIS — Z905 Acquired absence of kidney: Secondary | ICD-10-CM

## 2024-05-11 DIAGNOSIS — E038 Other specified hypothyroidism: Secondary | ICD-10-CM

## 2024-05-11 DIAGNOSIS — K582 Mixed irritable bowel syndrome: Secondary | ICD-10-CM

## 2024-05-11 DIAGNOSIS — G894 Chronic pain syndrome: Secondary | ICD-10-CM

## 2024-05-11 DIAGNOSIS — N3010 Interstitial cystitis (chronic) without hematuria: Secondary | ICD-10-CM

## 2024-05-11 DIAGNOSIS — M51369 Other intervertebral disc degeneration, lumbar region without mention of lumbar back pain or lower extremity pain: Secondary | ICD-10-CM

## 2024-05-11 NOTE — Telephone Encounter (Signed)
 Yes, which should be towards the end of the day.

## 2024-05-11 NOTE — Telephone Encounter (Signed)
 Patient called requesting if her NPT FU visit could be reschedule as a my chart video visit.

## 2024-05-16 DIAGNOSIS — Z20828 Contact with and (suspected) exposure to other viral communicable diseases: Secondary | ICD-10-CM | POA: Diagnosis not present

## 2024-05-16 DIAGNOSIS — J069 Acute upper respiratory infection, unspecified: Secondary | ICD-10-CM | POA: Diagnosis not present

## 2024-05-16 DIAGNOSIS — U071 COVID-19: Secondary | ICD-10-CM | POA: Diagnosis not present

## 2024-05-17 DIAGNOSIS — R0600 Dyspnea, unspecified: Secondary | ICD-10-CM | POA: Diagnosis not present

## 2024-05-17 DIAGNOSIS — J206 Acute bronchitis due to rhinovirus: Secondary | ICD-10-CM | POA: Diagnosis not present

## 2024-05-17 DIAGNOSIS — I10 Essential (primary) hypertension: Secondary | ICD-10-CM | POA: Diagnosis not present

## 2024-05-17 DIAGNOSIS — Z7902 Long term (current) use of antithrombotics/antiplatelets: Secondary | ICD-10-CM | POA: Diagnosis not present

## 2024-05-17 NOTE — Telephone Encounter (Signed)
 Tried calling pt to schedule 4 month follow up. Phone is disconnected

## 2024-05-29 ENCOUNTER — Other Ambulatory Visit: Payer: Self-pay

## 2024-05-29 ENCOUNTER — Other Ambulatory Visit (HOSPITAL_COMMUNITY): Payer: Self-pay

## 2024-05-29 ENCOUNTER — Encounter: Payer: Self-pay | Admitting: Radiology

## 2024-05-29 MED ORDER — HYDROCHLOROTHIAZIDE 12.5 MG PO TABS
12.5000 mg | ORAL_TABLET | Freq: Every morning | ORAL | 5 refills | Status: AC
  Filled 2024-05-29: qty 30, 30d supply, fill #0
  Filled 2024-06-23: qty 30, 30d supply, fill #1
  Filled 2024-07-24: qty 30, 30d supply, fill #2

## 2024-05-30 ENCOUNTER — Other Ambulatory Visit: Payer: Self-pay

## 2024-05-31 ENCOUNTER — Other Ambulatory Visit (HOSPITAL_BASED_OUTPATIENT_CLINIC_OR_DEPARTMENT_OTHER): Payer: Self-pay

## 2024-05-31 MED ORDER — FLUZONE 0.5 ML IM SUSY
0.5000 mL | PREFILLED_SYRINGE | Freq: Once | INTRAMUSCULAR | 0 refills | Status: AC
  Filled 2024-05-31: qty 0.5, 1d supply, fill #0

## 2024-06-04 ENCOUNTER — Other Ambulatory Visit (HOSPITAL_COMMUNITY): Payer: Self-pay

## 2024-06-05 ENCOUNTER — Other Ambulatory Visit (HOSPITAL_COMMUNITY): Payer: Self-pay

## 2024-06-05 ENCOUNTER — Other Ambulatory Visit: Payer: Self-pay

## 2024-06-05 MED ORDER — TRAMADOL HCL 50 MG PO TABS
50.0000 mg | ORAL_TABLET | Freq: Four times a day (QID) | ORAL | 0 refills | Status: AC
  Filled 2024-06-05: qty 20, 5d supply, fill #0

## 2024-06-05 MED ORDER — BACLOFEN 20 MG PO TABS
20.0000 mg | ORAL_TABLET | Freq: Three times a day (TID) | ORAL | 5 refills | Status: AC | PRN
  Filled 2024-06-05: qty 90, 30d supply, fill #0
  Filled 2024-07-10: qty 90, 30d supply, fill #1

## 2024-06-05 MED ORDER — OMEPRAZOLE 40 MG PO CPDR
40.0000 mg | DELAYED_RELEASE_CAPSULE | Freq: Every morning | ORAL | 3 refills | Status: AC
  Filled 2024-06-05: qty 30, 30d supply, fill #0
  Filled 2024-07-03: qty 30, 30d supply, fill #1
  Filled 2024-07-31: qty 30, 30d supply, fill #2

## 2024-06-05 MED ORDER — CYCLOBENZAPRINE HCL 10 MG PO TABS
10.0000 mg | ORAL_TABLET | Freq: Every evening | ORAL | 2 refills | Status: AC | PRN
  Filled 2024-06-05: qty 30, 30d supply, fill #0
  Filled 2024-07-10: qty 30, 30d supply, fill #1

## 2024-06-05 MED ORDER — FLUTICASONE PROPIONATE 50 MCG/ACT NA SUSP
1.0000 | Freq: Every day | NASAL | 5 refills | Status: AC
  Filled 2024-06-05: qty 16, 30d supply, fill #0
  Filled 2024-07-03: qty 16, 60d supply, fill #1

## 2024-06-05 MED ORDER — ONDANSETRON HCL 4 MG PO TABS
4.0000 mg | ORAL_TABLET | Freq: Three times a day (TID) | ORAL | 1 refills | Status: AC | PRN
  Filled 2024-06-05: qty 30, 10d supply, fill #0
  Filled 2024-08-04: qty 30, 10d supply, fill #1

## 2024-06-06 ENCOUNTER — Other Ambulatory Visit (HOSPITAL_COMMUNITY): Payer: Self-pay

## 2024-06-06 ENCOUNTER — Other Ambulatory Visit: Payer: Self-pay

## 2024-06-06 MED ORDER — TRAMADOL HCL 50 MG PO TABS
50.0000 mg | ORAL_TABLET | Freq: Four times a day (QID) | ORAL | 0 refills | Status: DC
  Filled 2024-06-06 – 2024-06-23 (×2): qty 20, 5d supply, fill #0

## 2024-06-06 MED ORDER — VITAMIN D (ERGOCALCIFEROL) 1.25 MG (50000 UNIT) PO CAPS
50000.0000 [IU] | ORAL_CAPSULE | ORAL | 1 refills | Status: AC
  Filled 2024-06-06: qty 12, 84d supply, fill #0

## 2024-06-07 NOTE — Progress Notes (Signed)
 Virtual Visit via Video Note  I connected with Brenda Ruiz on 06/07/24 at 11:20 AM EST by a video enabled telemedicine application and verified that I am speaking with the correct person using two identifiers.  Location: Patient: At home Provider: In the office   I discussed the limitations of evaluation and management by telemedicine and the availability of in person appointments. The patient expressed understanding and agreed to proceed.  Subjective: Pain in multiple joints History of Present Illness: Brenda Ruiz is a 48 y.o. female with polyarthralgia and fatigue.  She was seen last in the office on March 22, 2024.  She states she continues to have pain and discomfort in her hands, legs and lower back.  She continues to have fatigue.  She states she could not go for physical therapy due to her busy schedule.  She has not started doing any exercises of her hands.  She has not noticed any joint swelling.  She continues to have some discomfort in her knee joints.  She gives history of morning stiffness lasting for about couple of hours.  She denies any difficulty climbing stairs or getting up from chair.  Patient reports morning stiffness for 2-2.5 hours.   Patient denies nocturnal pain.  Difficulty dressing/grooming: Denies Difficulty climbing stairs: Denies Difficulty getting out of chair: Denies Difficulty using hands for taps, buttons, cutlery, and/or writing: Reports   Review of Systems  Constitutional:  Positive for malaise/fatigue.  HENT:  Negative for ear pain and tinnitus.   Eyes:  Negative for blurred vision, double vision and pain.  Respiratory:  Negative for shortness of breath and wheezing.   Cardiovascular:  Negative for chest pain and palpitations.  Gastrointestinal:  Negative for blood in stool, constipation and diarrhea.  Genitourinary:  Negative for frequency and urgency.  Musculoskeletal:  Positive for joint pain. Negative for falls.  Skin:  Negative for  rash.  Neurological:  Negative for dizziness and headaches.  Endo/Heme/Allergies:  Does not bruise/bleed easily.  Psychiatric/Behavioral:  Negative for depression. The patient is nervous/anxious. The patient does not have insomnia.      Physical Exam Neurological:     Mental Status: She is alert and oriented to person, place, and time.  Psychiatric:        Mood and Affect: Mood normal.     Objective: March 22, 2024 WBC 12.1, platelets 457, CK2 88, anti-CCP negative, SPEP normal  March 22, 2024 Recent Results (from the past 2160 hours)  CBC with Differential/Platelet     Status: Abnormal   Collection Time: 03/22/24 11:57 AM  Result Value Ref Range   WBC 12.1 (H) 3.8 - 10.8 Thousand/uL   RBC 5.18 (H) 3.80 - 5.10 Million/uL   Hemoglobin 13.3 11.7 - 15.5 g/dL   HCT 58.5 64.9 - 54.9 %   MCV 79.9 (L) 80.0 - 100.0 fL   MCH 25.7 (L) 27.0 - 33.0 pg   MCHC 32.1 32.0 - 36.0 g/dL    Comment: For adults, a slight decrease in the calculated MCHC value (in the range of 30 to 32 g/dL) is most likely not clinically significant; however, it should be interpreted with caution in correlation with other red cell parameters and the patient's clinical condition.    RDW 13.6 11.0 - 15.0 %   Platelets 457 (H) 140 - 400 Thousand/uL   MPV 8.8 7.5 - 12.5 fL   Neutro Abs 7,926 (H) 1,500 - 7,800 cells/uL   Absolute Lymphocytes 3,134 850 - 3,900 cells/uL  Absolute Monocytes 811 200 - 950 cells/uL   Eosinophils Absolute 182 15 - 500 cells/uL   Basophils Absolute 48 0 - 200 cells/uL   Neutrophils Relative % 65.5 %   Total Lymphocyte 25.9 %   Monocytes Relative 6.7 %   Eosinophils Relative 1.5 %   Basophils Relative 0.4 %  CK     Status: Abnormal   Collection Time: 03/22/24 11:57 AM  Result Value Ref Range   Total CK 288 (H) 20 - 239 U/L  Cyclic citrul peptide antibody, IgG     Status: None   Collection Time: 03/22/24 11:57 AM  Result Value Ref Range   Cyclic Citrullin Peptide Ab <83 UNITS     Comment: Reference Range Negative:            <20 Weak Positive:       20-39 Moderate Positive:   40-59 Strong Positive:     >59 .   Serum protein electrophoresis with reflex     Status: Abnormal   Collection Time: 03/22/24 11:57 AM  Result Value Ref Range   Total Protein 7.6 6.1 - 8.1 g/dL   Albumin ELP 4.3 3.8 - 4.8 g/dL   Alpha 1 0.4 (H) 0.2 - 0.3 g/dL   Alpha 2 0.9 0.5 - 0.9 g/dL   Beta Globulin 0.6 0.4 - 0.6 g/dL   Beta 2 0.5 0.2 - 0.5 g/dL   Gamma Globulin 1.0 0.8 - 1.7 g/dL   SPE Interp.      Comment: Normal Serum Protein Electrophoresis Pattern. No abnormal protein bands (M-protein) detected.       Assessment and Plan: Pain in both hands-patient continues to have discomfort in both hands.  The clinical and radiographic findings with history of osteoarthritis.  X-rays obtained at the last visit were reviewed with the patient.  Anti-CCP was negative.  Joint protection and muscle strengthening was discussed.  She was encouraged to do hand strengthening exercises.  Pain in both legs-she has been experiencing discomfort in her both lower extremities and knees.  X-rays obtained in the past were reviewed at the last visit which were unremarkable.  She was encouraged to do lower extremity muscle strengthening exercises.  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  She continues to have lower back pain.  She was referred to physical therapy at the last visit.  Patient states she did not get time for physical therapy but she plans to go for physical therapy.  Elevated CRP her CRP was mildly elevated but the sed rate was normal.  No synovitis was noted.  Other fatigue-she complains of fatigue.  CK was mildly elevated.  I advised her to have repeat CK with her PCP and  also repeat CBC due to elevated WBC count and platelets.  SPEP was normal.  The results were discussed with the patient.     Follow Up Instructions: Patient was advised to call us  if she develops any new symptoms.    I discussed the assessment and treatment plan with the patient. The patient was provided an opportunity to ask questions and all were answered. The patient agreed with the plan and demonstrated an understanding of the instructions.   The patient was advised to call back or seek an in-person evaluation if the symptoms worsen or if the condition  fails to improve as anticipated.  I provided 20 minutes of non-face-to-face time during this encounter.   Maya Nash, MD

## 2024-06-12 ENCOUNTER — Other Ambulatory Visit: Payer: Self-pay | Admitting: Nurse Practitioner

## 2024-06-12 DIAGNOSIS — Z1231 Encounter for screening mammogram for malignant neoplasm of breast: Secondary | ICD-10-CM

## 2024-06-13 ENCOUNTER — Other Ambulatory Visit: Payer: Self-pay

## 2024-06-21 ENCOUNTER — Encounter: Payer: Self-pay | Admitting: Rheumatology

## 2024-06-21 ENCOUNTER — Ambulatory Visit: Attending: Rheumatology | Admitting: Rheumatology

## 2024-06-21 VITALS — Ht 66.0 in | Wt 281.0 lb

## 2024-06-21 DIAGNOSIS — Z905 Acquired absence of kidney: Secondary | ICD-10-CM

## 2024-06-21 DIAGNOSIS — M79642 Pain in left hand: Secondary | ICD-10-CM

## 2024-06-21 DIAGNOSIS — R7982 Elevated C-reactive protein (CRP): Secondary | ICD-10-CM | POA: Diagnosis not present

## 2024-06-21 DIAGNOSIS — F410 Panic disorder [episodic paroxysmal anxiety] without agoraphobia: Secondary | ICD-10-CM

## 2024-06-21 DIAGNOSIS — K582 Mixed irritable bowel syndrome: Secondary | ICD-10-CM

## 2024-06-21 DIAGNOSIS — M79641 Pain in right hand: Secondary | ICD-10-CM | POA: Diagnosis not present

## 2024-06-21 DIAGNOSIS — G894 Chronic pain syndrome: Secondary | ICD-10-CM

## 2024-06-21 DIAGNOSIS — M51369 Other intervertebral disc degeneration, lumbar region without mention of lumbar back pain or lower extremity pain: Secondary | ICD-10-CM

## 2024-06-21 DIAGNOSIS — K219 Gastro-esophageal reflux disease without esophagitis: Secondary | ICD-10-CM

## 2024-06-21 DIAGNOSIS — R5383 Other fatigue: Secondary | ICD-10-CM

## 2024-06-21 DIAGNOSIS — E782 Mixed hyperlipidemia: Secondary | ICD-10-CM

## 2024-06-21 DIAGNOSIS — G500 Trigeminal neuralgia: Secondary | ICD-10-CM

## 2024-06-21 DIAGNOSIS — M79662 Pain in left lower leg: Secondary | ICD-10-CM

## 2024-06-21 DIAGNOSIS — F419 Anxiety disorder, unspecified: Secondary | ICD-10-CM

## 2024-06-21 DIAGNOSIS — N809 Endometriosis, unspecified: Secondary | ICD-10-CM

## 2024-06-21 DIAGNOSIS — N3010 Interstitial cystitis (chronic) without hematuria: Secondary | ICD-10-CM

## 2024-06-21 DIAGNOSIS — D518 Other vitamin B12 deficiency anemias: Secondary | ICD-10-CM

## 2024-06-21 DIAGNOSIS — M79661 Pain in right lower leg: Secondary | ICD-10-CM | POA: Diagnosis not present

## 2024-06-21 DIAGNOSIS — R748 Abnormal levels of other serum enzymes: Secondary | ICD-10-CM | POA: Diagnosis not present

## 2024-06-21 DIAGNOSIS — E559 Vitamin D deficiency, unspecified: Secondary | ICD-10-CM

## 2024-06-21 DIAGNOSIS — E038 Other specified hypothyroidism: Secondary | ICD-10-CM

## 2024-06-23 ENCOUNTER — Other Ambulatory Visit: Payer: Self-pay

## 2024-06-23 ENCOUNTER — Other Ambulatory Visit (HOSPITAL_COMMUNITY): Payer: Self-pay

## 2024-06-26 ENCOUNTER — Other Ambulatory Visit (HOSPITAL_COMMUNITY): Payer: Self-pay

## 2024-06-26 DIAGNOSIS — J206 Acute bronchitis due to rhinovirus: Secondary | ICD-10-CM | POA: Diagnosis not present

## 2024-06-26 MED ORDER — MONTELUKAST SODIUM 10 MG PO TABS
10.0000 mg | ORAL_TABLET | Freq: Every day | ORAL | 2 refills | Status: AC
  Filled 2024-06-26: qty 30, 30d supply, fill #0
  Filled 2024-07-24: qty 30, 30d supply, fill #1

## 2024-07-03 ENCOUNTER — Other Ambulatory Visit: Payer: Self-pay

## 2024-07-03 ENCOUNTER — Other Ambulatory Visit (HOSPITAL_COMMUNITY): Payer: Self-pay

## 2024-07-03 MED ORDER — HYDROXYCHLOROQUINE SULFATE 200 MG PO TABS
200.0000 mg | ORAL_TABLET | Freq: Two times a day (BID) | ORAL | 1 refills | Status: AC
  Filled 2024-07-03: qty 60, 30d supply, fill #0
  Filled 2024-07-29: qty 60, 30d supply, fill #1

## 2024-07-03 MED ORDER — GABAPENTIN 400 MG PO CAPS
400.0000 mg | ORAL_CAPSULE | Freq: Two times a day (BID) | ORAL | 2 refills | Status: AC
  Filled 2024-07-03: qty 60, 30d supply, fill #0

## 2024-07-04 ENCOUNTER — Other Ambulatory Visit (HOSPITAL_COMMUNITY): Payer: Self-pay

## 2024-07-04 MED ORDER — GABAPENTIN 600 MG PO TABS
600.0000 mg | ORAL_TABLET | Freq: Every morning | ORAL | 5 refills | Status: AC
  Filled 2024-07-04: qty 30, 30d supply, fill #0

## 2024-07-05 ENCOUNTER — Encounter: Payer: Self-pay | Admitting: Orthopedic Surgery

## 2024-07-05 ENCOUNTER — Ambulatory Visit: Admitting: Orthopedic Surgery

## 2024-07-05 DIAGNOSIS — M79641 Pain in right hand: Secondary | ICD-10-CM

## 2024-07-05 NOTE — Progress Notes (Signed)
 Office Visit Note   Patient: Brenda Ruiz           Date of Birth: 1975/09/05           MRN: [id number] Visit Date: 07/05/2024 Requested by: Dorene Perkins, NP [address] [place],  [place] [address] PCP: Dorene Perkins, NP  Subjective: Chief Complaint  Patient presents with   Other    Bilateral hand pain     HPI: Brenda Ruiz is a 48 y.o. female who presents to the office reporting bilateral hand pain right worse than left.  She is right-hand dominant.  Has had chronic pain for years.  Much more intense for the last 2 months.  Describes morning stiffness and is unable to make a fist in the morning.  She tried a home exercise program which did not help.  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  She denies any finger locking or numbness and tingling.  She sees a rheumatologist here in town.  Got another opinion from a rheumatologist with [facility] and they started her on Plaquenil .  She has not noticed any improvement.  Pain does wake her from sleep at night.  Cold makes it worse.  She has tried compression gloves.  She cannot take anti-inflammatories because of kidney issues.  Most of the pain is in the PIP and DIP joints as well as at the base of the thumbs.  Takes Tylenol with some relief as well as Ultram ..                ROS: All systems reviewed are negative as they relate to the chief complaint within the history of present illness.  Patient denies fevers or chills.  Assessment & Plan: Visit Diagnoses:  1. Pain in right hand     Plan: Impression is bilateral hand pain unclear etiology.  Radiographs do not show much in terms of arthritis she has good range of motion.  Not too much warmth or synovitis around any of the joints.  She does have a history of bilateral carpal tunnel releases.  This could be referred pain from the neck but I think that is little less likely needs MRI of the right hand to evaluate distal joint pain.  She has had extensive  laboratory workup for spondyloarthropathy.  Do not know if there is really anything actionable that we are going to do from her cervical perspective with this pain.  Follow-Up Instructions: No follow-ups on file.   Orders:  Orders Placed This Encounter  Procedures   MR HAND RIGHT WO CONTRAST   No orders of the defined types were placed in this encounter.     Procedures: No procedures performed   Clinical Data: No additional findings.  Objective: Vital Signs: There were no vitals taken for this visit.  Physical Exam:  Constitutional: Patient appears well-developed HEENT:  Head: Normocephalic Eyes:EOM are normal Neck: Normal range of motion Cardiovascular: Normal rate Pulmonary/chest: Effort normal Neurologic: Patient is alert Skin: Skin is warm Psychiatric: Patient has normal mood and affect  Ortho Exam: Ortho exam demonstrates not too much synovitis or swelling or warmth in the MCP PIP or DIP joints.  She does have full composite flexion and extension of the fingers and thumbs.  Equivocal grind test bilaterally.  EPL FPL interosseous strength is intact.  No snuffbox tenderness and no radial styloid tenderness.  Cervical spine range of motion intact.  Radial pulse intact bilaterally.  Both hands perfused and  sensate.  Specialty Comments:  No specialty comments available.  Imaging: No results found.   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   Increased frequency of urination 07/14/2022   Urge incontinence 07/14/2022   Urinary urgency 07/14/2022   Vitamin D  deficiency    Panic disorder (episodic paroxysmal anxiety)    Other  vitamin B12 deficiency anemias    Morbid obesity due to excess calories (HCC)    Mixed irritable bowel syndrome    Mixed hyperlipidemia    GERD without esophagitis    Cough    Chronic pain syndrome    Chronic otitis media    BMI 45.0-49.9, adult (HCC)    Anxiety disorder, unspecified    Solitary right kidney    Trigeminal neuralgia    Hypothyroid    IC (interstitial cystitis) 03/22/2018   Past Medical History:  Diagnosis Date   Acute upper respiratory infection, unspecified    Anxiety    Anxiety disorder, unspecified    Arthritis    Bilateral primary osteoarthritis of knee    right    Blood transfusion without reported diagnosis 1988   Left nephrectomy   BMI 45.0-49.9, adult (HCC)    Chronic otitis media    tubes in ears   Chronic pain syndrome    Cough    Depression    Endometriosis    Fever, unspecified    Fibromyalgia    GERD without esophagitis    Hypothyroid    IBS (irritable bowel syndrome)    Interstitial cystitis (chronic) without hematuria    Mixed hyperlipidemia    Mixed irritable bowel syndrome    Morbid obesity due to excess calories (HCC)    Other vitamin B12 deficiency anemias    Panic disorder (episodic paroxysmal anxiety)    Solitary right kidney    TMJ (temporomandibular joint disorder)    Trigeminal neuralgia    Trigeminal neuralgia    Vitamin D  deficiency     Family History  Problem Relation Age of Onset   Hypertension Mother    Hyperlipidemia Mother    Heart attack Mother    Heart attack Maternal Grandmother    Stroke Maternal Grandmother    Diabetes type II Maternal Grandfather    Hypertension Maternal Grandfather    Neuropathy Neg Hx    Colon cancer Neg Hx    Esophageal cancer Neg Hx     Past Surgical History:  Procedure Laterality Date   APPENDECTOMY  08/2023   bladder stent  1999   CARPAL TUNNEL RELEASE Left    carpel tunnel releae Right Mardh 2020   NEPHRECTOMY Left 1989   tubes in ears  06/2017   Social History    Occupational History   Not on file  Tobacco Use   Smoking status: Never    Passive exposure: Past   Smokeless tobacco: Never  Vaping Use   Vaping status: Never Used  Substance and Sexual Activity   Alcohol use: Yes    Comment: ocassionally/1-2 times a week    Drug use: Not Currently   Sexual activity: Not on file

## 2024-07-10 ENCOUNTER — Other Ambulatory Visit: Payer: Self-pay

## 2024-07-10 ENCOUNTER — Other Ambulatory Visit (HOSPITAL_COMMUNITY): Payer: Self-pay

## 2024-07-11 ENCOUNTER — Other Ambulatory Visit (HOSPITAL_COMMUNITY): Payer: Self-pay

## 2024-07-11 ENCOUNTER — Inpatient Hospital Stay: Admission: RE | Admit: 2024-07-11 | Discharge: 2024-07-11

## 2024-07-11 DIAGNOSIS — Z1231 Encounter for screening mammogram for malignant neoplasm of breast: Secondary | ICD-10-CM | POA: Diagnosis not present

## 2024-07-12 ENCOUNTER — Other Ambulatory Visit: Payer: Self-pay

## 2024-07-12 ENCOUNTER — Other Ambulatory Visit (HOSPITAL_COMMUNITY): Payer: Self-pay

## 2024-07-12 ENCOUNTER — Encounter: Payer: Self-pay | Admitting: Orthopedic Surgery

## 2024-07-12 MED ORDER — TRAMADOL HCL 50 MG PO TABS
50.0000 mg | ORAL_TABLET | Freq: Four times a day (QID) | ORAL | 0 refills | Status: AC
  Filled 2024-07-12: qty 20, 5d supply, fill #0

## 2024-07-24 ENCOUNTER — Other Ambulatory Visit: Payer: Self-pay

## 2024-07-26 ENCOUNTER — Ambulatory Visit
Admission: RE | Admit: 2024-07-26 | Discharge: 2024-07-26 | Disposition: A | Discharge status: Home / Self Care | Source: Ambulatory Visit | Attending: Orthopedic Surgery | Admitting: Orthopedic Surgery

## 2024-07-26 DIAGNOSIS — M79641 Pain in right hand: Secondary | ICD-10-CM

## 2024-07-29 ENCOUNTER — Other Ambulatory Visit (HOSPITAL_COMMUNITY): Payer: Self-pay

## 2024-07-31 ENCOUNTER — Other Ambulatory Visit (HOSPITAL_COMMUNITY): Payer: Self-pay

## 2024-07-31 ENCOUNTER — Other Ambulatory Visit: Payer: Self-pay

## 2024-07-31 MED ORDER — LEVOTHYROXINE SODIUM 175 MCG PO TABS
175.0000 ug | ORAL_TABLET | Freq: Every morning | ORAL | 1 refills | Status: AC
  Filled 2024-07-31: qty 90, 90d supply, fill #0

## 2024-08-01 ENCOUNTER — Other Ambulatory Visit (HOSPITAL_COMMUNITY): Payer: Self-pay

## 2024-08-01 ENCOUNTER — Other Ambulatory Visit: Payer: Self-pay

## 2024-08-02 ENCOUNTER — Other Ambulatory Visit (HOSPITAL_COMMUNITY): Payer: Self-pay

## 2024-08-02 ENCOUNTER — Other Ambulatory Visit: Payer: Self-pay

## 2024-08-02 MED ORDER — GABAPENTIN 300 MG PO CAPS
300.0000 mg | ORAL_CAPSULE | Freq: Three times a day (TID) | ORAL | 2 refills | Status: AC
  Filled 2024-08-02: qty 90, 30d supply, fill #0

## 2024-08-02 MED ORDER — TRAMADOL HCL 50 MG PO TABS
50.0000 mg | ORAL_TABLET | Freq: Every day | ORAL | 2 refills | Status: AC | PRN
  Filled 2024-08-02: qty 30, 30d supply, fill #0

## 2024-08-04 ENCOUNTER — Other Ambulatory Visit: Payer: Self-pay

## 2024-08-04 ENCOUNTER — Other Ambulatory Visit (HOSPITAL_COMMUNITY): Payer: Self-pay

## 2024-08-07 ENCOUNTER — Other Ambulatory Visit (HOSPITAL_COMMUNITY): Payer: Self-pay

## 2024-08-07 MED ORDER — SERTRALINE HCL 100 MG PO TABS
100.0000 mg | ORAL_TABLET | Freq: Every day | ORAL | 2 refills | Status: AC
  Filled 2024-08-07: qty 30, 30d supply, fill #0

## 2024-08-08 ENCOUNTER — Encounter: Payer: Self-pay | Admitting: Orthopedic Surgery

## 2024-08-16 NOTE — Telephone Encounter (Signed)
 I called and left message.  In general she is get a little flexor synovitis on this small finger flexor tendon but otherwise underwhelming.  I think if she is continuing to have a lot of pain she may need to see [facility] just for a more specialized opinion about it.

## 2024-08-29 ENCOUNTER — Other Ambulatory Visit (HOSPITAL_COMMUNITY): Payer: Self-pay
